# Patient Record
Sex: Female | Born: 1960
Health system: Southern US, Community
[De-identification: ages and names within clinical notes are randomized; demographics above are authoritative.]

## PROBLEM LIST (undated history)

## (undated) DIAGNOSIS — K219 Gastro-esophageal reflux disease without esophagitis: Secondary | ICD-10-CM

## (undated) DIAGNOSIS — E039 Hypothyroidism, unspecified: Secondary | ICD-10-CM

## (undated) DIAGNOSIS — I209 Angina pectoris, unspecified: Secondary | ICD-10-CM

## (undated) DIAGNOSIS — G5600 Carpal tunnel syndrome, unspecified upper limb: Secondary | ICD-10-CM

## (undated) HISTORY — PX: TUBAL LIGATION: SHX77

## (undated) HISTORY — PX: BREAST SURGERY: SHX581

## (undated) HISTORY — PX: CARPAL TUNNEL RELEASE: SHX101

---

## 1999-01-22 ENCOUNTER — Other Ambulatory Visit: Admission: RE | Admit: 1999-01-22 | Discharge: 1999-01-22 | Payer: Self-pay | Admitting: Obstetrics and Gynecology

## 1999-05-21 ENCOUNTER — Emergency Department (HOSPITAL_COMMUNITY): Admission: EM | Admit: 1999-05-21 | Discharge: 1999-05-21 | Payer: Self-pay | Admitting: *Deleted

## 1999-09-30 ENCOUNTER — Other Ambulatory Visit: Admission: RE | Admit: 1999-09-30 | Discharge: 1999-09-30 | Payer: Self-pay | Admitting: Obstetrics and Gynecology

## 2000-12-08 ENCOUNTER — Other Ambulatory Visit: Admission: RE | Admit: 2000-12-08 | Discharge: 2000-12-08 | Payer: Self-pay | Admitting: Obstetrics and Gynecology

## 2002-01-24 ENCOUNTER — Other Ambulatory Visit: Admission: RE | Admit: 2002-01-24 | Discharge: 2002-01-24 | Payer: Self-pay | Admitting: Obstetrics and Gynecology

## 2002-02-10 ENCOUNTER — Emergency Department (HOSPITAL_COMMUNITY): Admission: EM | Admit: 2002-02-10 | Discharge: 2002-02-10 | Payer: Self-pay | Admitting: Emergency Medicine

## 2002-04-02 ENCOUNTER — Encounter: Payer: Self-pay | Admitting: Internal Medicine

## 2002-04-02 ENCOUNTER — Encounter: Admission: RE | Admit: 2002-04-02 | Discharge: 2002-04-02 | Payer: Self-pay | Admitting: Internal Medicine

## 2002-04-12 ENCOUNTER — Encounter (INDEPENDENT_AMBULATORY_CARE_PROVIDER_SITE_OTHER): Payer: Self-pay | Admitting: Specialist

## 2002-04-12 ENCOUNTER — Ambulatory Visit (HOSPITAL_COMMUNITY): Admission: RE | Admit: 2002-04-12 | Discharge: 2002-04-12 | Payer: Self-pay | Admitting: Internal Medicine

## 2002-04-12 ENCOUNTER — Encounter: Payer: Self-pay | Admitting: Internal Medicine

## 2002-08-09 ENCOUNTER — Other Ambulatory Visit: Admission: RE | Admit: 2002-08-09 | Discharge: 2002-08-09 | Payer: Self-pay | Admitting: Radiology

## 2003-01-29 ENCOUNTER — Other Ambulatory Visit: Admission: RE | Admit: 2003-01-29 | Discharge: 2003-01-29 | Payer: Self-pay | Admitting: Obstetrics and Gynecology

## 2003-06-16 ENCOUNTER — Encounter (INDEPENDENT_AMBULATORY_CARE_PROVIDER_SITE_OTHER): Payer: Self-pay | Admitting: Specialist

## 2003-06-16 ENCOUNTER — Ambulatory Visit (HOSPITAL_COMMUNITY): Admission: RE | Admit: 2003-06-16 | Discharge: 2003-06-16 | Payer: Self-pay | Admitting: Obstetrics and Gynecology

## 2004-02-18 ENCOUNTER — Other Ambulatory Visit: Admission: RE | Admit: 2004-02-18 | Discharge: 2004-02-18 | Payer: Self-pay | Admitting: Obstetrics and Gynecology

## 2004-02-25 ENCOUNTER — Ambulatory Visit (HOSPITAL_COMMUNITY): Admission: RE | Admit: 2004-02-25 | Discharge: 2004-02-25 | Payer: Self-pay | Admitting: Obstetrics and Gynecology

## 2004-07-20 DIAGNOSIS — D099 Carcinoma in situ, unspecified: Secondary | ICD-10-CM | POA: Insufficient documentation

## 2005-01-11 ENCOUNTER — Other Ambulatory Visit: Admission: RE | Admit: 2005-01-11 | Discharge: 2005-01-11 | Payer: Self-pay | Admitting: Obstetrics and Gynecology

## 2005-03-15 ENCOUNTER — Ambulatory Visit (HOSPITAL_COMMUNITY): Admission: RE | Admit: 2005-03-15 | Discharge: 2005-03-15 | Payer: Self-pay | Admitting: Obstetrics and Gynecology

## 2005-12-21 ENCOUNTER — Other Ambulatory Visit: Admission: RE | Admit: 2005-12-21 | Discharge: 2005-12-21 | Payer: Self-pay | Admitting: Obstetrics and Gynecology

## 2006-02-22 ENCOUNTER — Encounter: Admission: RE | Admit: 2006-02-22 | Discharge: 2006-02-22 | Payer: Self-pay | Admitting: Internal Medicine

## 2006-03-17 ENCOUNTER — Ambulatory Visit (HOSPITAL_COMMUNITY): Admission: RE | Admit: 2006-03-17 | Discharge: 2006-03-17 | Payer: Self-pay | Admitting: Obstetrics and Gynecology

## 2007-02-21 ENCOUNTER — Encounter: Admission: RE | Admit: 2007-02-21 | Discharge: 2007-02-21 | Payer: Self-pay | Admitting: Endocrinology

## 2007-03-06 ENCOUNTER — Other Ambulatory Visit: Admission: RE | Admit: 2007-03-06 | Discharge: 2007-03-06 | Payer: Self-pay | Admitting: Interventional Radiology

## 2007-03-06 ENCOUNTER — Encounter: Admission: RE | Admit: 2007-03-06 | Discharge: 2007-03-06 | Payer: Self-pay | Admitting: Endocrinology

## 2007-03-06 ENCOUNTER — Encounter (INDEPENDENT_AMBULATORY_CARE_PROVIDER_SITE_OTHER): Payer: Self-pay | Admitting: Interventional Radiology

## 2007-03-20 ENCOUNTER — Ambulatory Visit (HOSPITAL_COMMUNITY): Admission: RE | Admit: 2007-03-20 | Discharge: 2007-03-20 | Payer: Self-pay | Admitting: Obstetrics and Gynecology

## 2008-03-21 ENCOUNTER — Ambulatory Visit (HOSPITAL_COMMUNITY): Admission: RE | Admit: 2008-03-21 | Discharge: 2008-03-21 | Payer: Self-pay | Admitting: Obstetrics and Gynecology

## 2008-09-30 ENCOUNTER — Encounter: Admission: RE | Admit: 2008-09-30 | Discharge: 2008-09-30 | Payer: Self-pay | Admitting: Endocrinology

## 2009-03-23 ENCOUNTER — Ambulatory Visit (HOSPITAL_COMMUNITY): Admission: RE | Admit: 2009-03-23 | Discharge: 2009-03-23 | Payer: Self-pay | Admitting: Obstetrics and Gynecology

## 2009-06-10 ENCOUNTER — Encounter: Admission: RE | Admit: 2009-06-10 | Discharge: 2009-06-10 | Payer: Self-pay | Admitting: Endocrinology

## 2009-12-09 ENCOUNTER — Encounter: Admission: RE | Admit: 2009-12-09 | Discharge: 2009-12-09 | Payer: Self-pay | Admitting: Endocrinology

## 2010-03-24 ENCOUNTER — Ambulatory Visit (HOSPITAL_COMMUNITY): Admission: RE | Admit: 2010-03-24 | Discharge: 2010-03-24 | Payer: Self-pay | Admitting: Obstetrics and Gynecology

## 2010-09-26 ENCOUNTER — Encounter: Payer: Self-pay | Admitting: Endocrinology

## 2010-10-04 LAB — CBC
HCT: 37.2 % (ref 36.0–46.0)
Hemoglobin: 12 g/dL (ref 12.0–15.0)
MCHC: 32.3 g/dL (ref 30.0–36.0)
Platelets: 220 10*3/uL (ref 150–400)
RBC: 4.49 MIL/uL (ref 3.87–5.11)

## 2010-10-04 LAB — DIFFERENTIAL
Basophils Relative: 0 % (ref 0–1)
Eosinophils Relative: 2 % (ref 0–5)
Lymphocytes Relative: 53 % — ABNORMAL HIGH (ref 12–46)
Monocytes Relative: 8 % (ref 3–12)
Neutrophils Relative %: 38 % — ABNORMAL LOW (ref 43–77)

## 2010-10-04 LAB — SURGICAL PCR SCREEN: MRSA, PCR: NEGATIVE

## 2010-10-04 LAB — COMPREHENSIVE METABOLIC PANEL
Chloride: 103 mEq/L (ref 96–112)
GFR calc Af Amer: >60 mL/min (ref >60)
Sodium: 137 mEq/L (ref 135–145)
Total Bilirubin: 0.5 mg/dL (ref 0.3–1.2)
Total Protein: 7.9 g/dL (ref 6.0–8.3)

## 2010-10-13 ENCOUNTER — Encounter: Payer: Self-pay | Admitting: Family Medicine

## 2010-10-13 ENCOUNTER — Ambulatory Visit (HOSPITAL_COMMUNITY)
Admission: RE | Admit: 2010-10-13 | Discharge: 2010-10-14 | Disposition: A | Discharge status: Home / Self Care | Payer: 59 | Attending: General Surgery | Admitting: General Surgery

## 2010-10-13 ENCOUNTER — Other Ambulatory Visit: Payer: Self-pay | Admitting: General Surgery

## 2010-10-13 DIAGNOSIS — K219 Gastro-esophageal reflux disease without esophagitis: Secondary | ICD-10-CM | POA: Insufficient documentation

## 2010-10-13 DIAGNOSIS — E042 Nontoxic multinodular goiter: Secondary | ICD-10-CM | POA: Insufficient documentation

## 2010-10-13 HISTORY — PX: TOTAL THYROIDECTOMY: SHX2547

## 2010-10-13 LAB — CALCIUM: Calcium: 9.2 mg/dL (ref 8.4–10.5)

## 2010-10-14 LAB — GLUCOSE, CAPILLARY: Glucose-Capillary: 97 mg/dL (ref 70–99)

## 2010-10-14 LAB — CALCIUM: Calcium: 10.5 mg/dL (ref 8.4–10.5)

## 2010-10-21 NOTE — Op Note (Addendum)
Colleen Carter, Colleen Carter               ACCOUNT NO.:  [id number]  MEDICAL RECORD NO.:  [id number]           PATIENT TYPE:  O  LOCATION:  1606                         FACILITY:  [facility] [place]  PHYSICIAN:  Lennie Muckle, MD      DATE OF BIRTH:  March 06, 1961  DATE OF PROCEDURE:  10/13/2010 DATE OF DISCHARGE:                              OPERATIVE REPORT   PREOPERATIVE DIAGNOSIS:  Multinodular goiter.  POSTOPERATIVE DIAGNOSIS:  Multinodular goiter.  PROCEDURE:  Total thyroidectomy.  SURGEON:  Brienne Liguori L. Freida Busman, MD  ASSISTANT:  Anselm Pancoast. Weatherly, MD  FINDINGS:  Multiple nodules with a very large left thyroid gland.  SPECIMENS:  Thyroid.  Minimal amount of blood loss.  ANESTHESIA:  General endotracheal anesthesia.  No immediate complications.  The patient to PACU in stable condition.  INDICATIONS FOR PROCEDURE:  Colleen Carter is a 50 year old female who had had a goiter for sometime.  She had been on Synthroid and began to have difficulty with dysphagia and compression type symptoms.  She was seen preoperatively.  Risks of surgery were explained and informed consent was obtained.  DETAILS OF PROCEDURE:  Colleen Carter was identified in the preoperative holding area and all questions were answered.  She was seen by Anesthesia and taken to the operating room.  She received 2 g of cefazolin preoperatively.  Once in the operating room, placed in supine position after administration of general endotracheal anesthesia, sequential compression devices were applied to her lower extremity. Head and arms tucked and a towel roll placed beneath the shoulder blades.  Anterior neck was prepped and draped in usual sterile fashion. Surgical time-out performed.  I began by palpating the anatomic landmark at the  sternal notch and the trachea.  Two fingerbreadths above the sternal notch, I did an incision approximately 8 cm in length.  Divided the skin with a #15 blade.  Subcutaneous tissues divided  by electrocautery, divide the platysma muscle, created flaps superiorly and anteriorly, small anterior vein was controlled with a 2-0 silk tie. With elevated the flap, the platysmas inferiorly and superiorly, I chose the midline of the strap muscle.  I divided this with electrocautery, began dissecting on the left thyroid gland.  I swept away the strap muscles with electrocautery and blunt dissection.  The left lobe was rather large.  I was able to gain access to the superior pole vessels, placed 2 clips proximally and 1 distally, and transected with the Harmonic scalpel.  I continued dissecting inferiorly down towards the middle pole.  I then chose to release the inferior pole.  I swept the strap muscles away from the thyroid and likely was able to sweep away without much difficulty.  I was able to clip and divide the inferior pole vessels towards the middle pole vessels, we were able to visualize the recurrent laryngeal nerve, staying high on the thyroid gland and swept the small feeding vessels and placed clips on the smaller vessels. Using Harmonic scalpel, dissected the thyroid away from the vicinity of the recurrent laryngeal nerve.  We were able to continue dissecting the thyroid towards the midline.  We then found branch  of the pyramidal pole.  With an electrocautery, we brought this down towards the midline. We then continued dissecting on the right thyroid gland sweeping the strap muscles away using electrocautery and blunt dissection.  I placed superior pole vessels, placed 2 clips proximally and 1 distally, and transected with the Harmonic scalpel.  Then, I was able to elevate the inferior pole and divide these vessels with clips and the Harmonic scalpel.  We also were able to visualize the recurrent laryngeal nerve as well as the superior parathyroid within the vicinity.  I swept the superior parathyroid away from the thyroid gland using blunt dissection and swept the thyroid  gland away from the recurrent laryngeal nerve. Staying high on the thyroid gland, I was able to sweep this away without difficulty and dissected towards the trachea. Once the specimen was removed, I marked the superior pole of the right thyroid with a 2-0 silk suture.  I then irrigated the wound bed, found minimal amount of oozing which I placed a small clip on the left side.  Upon final irrigation and look, there was no evidence of bleeding.  Recurrent laryngeal nerves were intact and visualized.  I then placed Surgicel within both wound beds.  The patient had Valsalva maneuver and found no bleeding.  I then reapproximated strap muscles in midline with a 3-0 Vicryl running suture, platysma was reapproximated using interrupted sutures.  I then reapproximated the dermis with a 3-0 Vicryl and skin was closed with 4-0 Monocryl.  I injected 50 mL of 0.25% Marcaine for local anesthesia. Steri-Strips placed as a final dressing after closing the skin with a 4- 0 Monocryl.  She was awakened and transferred to post-anesthesia care unit in stable condition.  She will have her calcium monitored daily and discharged home tomorrow.     Lennie Muckle, MD ALA/MEDQ  D:  10/13/2010  T:  10/14/2010  Job:  [id number]  cc:   Dorisann Frames, M.D. Fax: [phone]  Hal Morales, M.D. Fax: [phone]  Electronically Signed by Bertram Savin MD on 10/21/2010 02:16:54 PM

## 2011-01-21 NOTE — Op Note (Signed)
NAMETonye Carter                         ACCOUNT NO.:  [id number]   MEDICAL RECORD NO.:  [id number]                   PATIENT TYPE:  AMB   LOCATION:  SDC                                  FACILITY:  [facility]   PHYSICIAN:  Hal Morales, M.D.             DATE OF BIRTH:  1961-01-15   DATE OF PROCEDURE:  06/16/2003  DATE OF DISCHARGE:                                 OPERATIVE REPORT   PREOPERATIVE DIAGNOSIS:  Desire for surgical sterilization.   POSTOPERATIVE DIAGNOSES:  Desire for surgical sterilization, plus uterine  fibroids, and rule out endometriosis.   OPERATION:  Operative laparoscopy, laparoscopic tubal cautery, and posterior  peritoneal biopsy.   SURGEON:  Hal Morales, M.D.   ANESTHESIA:  General orotracheal.   ESTIMATED BLOOD LOSS:  Less than 25 cc.   COMPLICATIONS:  None.   FINDINGS:  The uterus was upper limits of normal size.  There were less than  2 cm myomata on the left anterior fundus and left cornual region.  There was  a single powder burn type lesion in the center of the posterior cul-de-sac  between the two uterosacral ligaments.  The ovaries appeared normal  bilaterally except for a functional cyst on the right.  There were no other  stigmata of endometriosis.  The tubes appeared normal.   PROCEDURE:  The patient was taken to the operating room after appropriate  identification and placed on the operating table.  After the attainment of  adequate general anesthesia, she was placed in the modified lithotomy  position.  The abdomen, perineum, and vagina were prepped with multiple  layers of Betadine.  A red Robinson catheter was used to empty the bladder,  and a single-toothed tenaculum placed on the anterior cervix.  The abdomen  was draped as a sterile field.  A subumbilical and suprapubic injection of  0.25% Marcaine for a total of 10 cc was undertaken.  A subumbilical incision  was made and the Veress cannula placed through that incision into  the  peritoneal cavity.  A pneumoperitoneum was created with 4 liters of CO2.  The laparoscopic trocar was placed through the subumbilical incision, and  the laparoscope placed through the trocar sleeve.  The above-noted findings  were made and documented.  The right fallopian tube was then identified,  followed to its fimbriated end, then grasped at the isthmic portion and  cauterized in two adjacent areas.  The left fallopian tube was treated in a  likewise manner.  A biopsy of the posterior cul-de-sac powder burn lesion  was undertaken with hemostasis noted to be adequate.  All instruments were  then removed from the peritoneal cavity under direct visualization as the  CO2 was allowed to escape.  A fascial suture of 0 Vicryl was placed in the  subumbilical incision and a subcuticular suture of 3-0 Vicryl used to close  the skin incision.  The Veress cannula was  removed, and two sutures of 0  Vicryl used to achieve hemostasis in the cervix at the site of the tenaculum  stick.  The patient was awakened from general  anesthesia and taken to the recovery room in satisfactory condition having  tolerated the procedure well with sponge and instrument counts correct.   SPECIMENS TO PATHOLOGY:  Posterior cul-de-sac biopsy, rule out  endometriosis.                                               Hal Morales, M.D.    VPH/MEDQ  D:  06/16/2003  T:  06/16/2003  Job:  [id number]

## 2011-01-21 NOTE — H&P (Signed)
NAMETonye Carter                         ACCOUNT NO.:  [id number]   MEDICAL RECORD NO.:  [id number]                   PATIENT TYPE:  AMB   LOCATION:  SDC                                  FACILITY:  [facility]   PHYSICIAN:  Hal Morales, M.D.             DATE OF BIRTH:  Jan 06, 1961   DATE OF ADMISSION:  DATE OF DISCHARGE:                                HISTORY & PHYSICAL   DATE OF OPERATION:  June 16, 2003   HISTORY OF PRESENT ILLNESS:  The patient is a 50 year old black single  female para 0 who wishes surgical sterilization.  The patient has used oral  contraceptive pills for many years for contraception with good success in  regularization of her usually irregular periods as well as good  contraception.  The patient was able to discontinue cigarette smoking at one  point in time; however, has started smoking again and realizes the dangers  of continued birth control pill use and cigarette smoking.  After  consideration of other options for contraception the patient has decided to  proceed with surgical sterilization.  She understands the permanence of the  procedure as well as the risks of tubal failure with subsequent pregnancy.  Last menstrual period was May 03, 2003.  The patient took her last birth  control pill in July 2004 and had withdrawal menses, then the menstrual  cycle on May 03, 2003 lasting approximately five days.  The patient has  had no menses since that time.  She is currently abstinent with not having  had intercourse for over a year.   PAST MEDICAL HISTORY:  Negative.   SURGICAL HISTORY:  In 2003 the patient had a reduction mammoplasty and in  May 2004 she had removal of scar tissue from the right breast.   CURRENT MEDICATIONS:  None.   DRUG SENSITIVITIES:  None.   HABITS:  The patient smokes approximately 18 cigarettes per week.   SOCIAL HISTORY:  She lives alone and works as a Chartered certified accountant for [employer].   FAMILY HISTORY:  Significant for a  sister with diabetes and chronic  hypertension.   REVIEW OF SYSTEMS:  Essential negative.   PHYSICAL EXAMINATION:  GENERAL:  The patient is a well-developed black  female in no acute distress.  VITAL SIGNS:  The blood pressure is 124/60, weight is 225 pounds.  LUNGS:  Clear.  HEART:  Regular rate and rhythm.  ABDOMEN:  Soft without masses or organomegaly.  EXTREMITIES:  No clubbing, cyanosis, or edema.  PELVIC:  EG/BUS within normal limits.  The vagina is rugous.  The cervix is  without gross lesions.  The uterus is upper limits of normal size, mobile,  and nontender.  Adnexa:  No masses.  Rectovaginal:  No masses.   IMPRESSION:  1. Desire for surgical sterilization.  2. Long history of irregular menses.   DISPOSITION:  A discussion is held with the patient concerning the  indications for her procedure as well as the risks involved which include  but are not limited to anesthesia, bleeding, infection, and damage to  adjacent organs, as well as the risk of failure of tubal sterilization with  subsequent pregnancy.  The patient seems to understand and wishes to  proceed.  She will have routine laboratory studies performed as well as a  urine pregnancy test prior to her surgical procedure.  She has a normal TSH  a year ago.  The surgery will be performed at [facility] on June 16, 2003.                                               Hal Morales, M.D.    VPH/MEDQ  D:  06/10/2003  T:  06/10/2003  Job:  [id number]

## 2011-03-14 ENCOUNTER — Other Ambulatory Visit (HOSPITAL_COMMUNITY): Payer: Self-pay | Admitting: Obstetrics and Gynecology

## 2011-03-14 DIAGNOSIS — Z1231 Encounter for screening mammogram for malignant neoplasm of breast: Secondary | ICD-10-CM

## 2011-03-30 ENCOUNTER — Ambulatory Visit (HOSPITAL_COMMUNITY)
Admission: RE | Admit: 2011-03-30 | Discharge: 2011-03-30 | Disposition: A | Discharge status: Home / Self Care | Payer: 59 | Source: Ambulatory Visit | Attending: Obstetrics and Gynecology | Admitting: Obstetrics and Gynecology

## 2011-03-30 DIAGNOSIS — Z1231 Encounter for screening mammogram for malignant neoplasm of breast: Secondary | ICD-10-CM | POA: Insufficient documentation

## 2011-05-20 ENCOUNTER — Emergency Department (HOSPITAL_COMMUNITY)
Admission: EM | Admit: 2011-05-20 | Discharge: 2011-05-20 | Disposition: A | Discharge status: Home / Self Care | Payer: 59 | Attending: Emergency Medicine | Admitting: Emergency Medicine

## 2011-05-20 DIAGNOSIS — R040 Epistaxis: Secondary | ICD-10-CM | POA: Insufficient documentation

## 2012-02-23 ENCOUNTER — Other Ambulatory Visit: Payer: Self-pay | Admitting: Obstetrics and Gynecology

## 2012-02-23 DIAGNOSIS — Z1231 Encounter for screening mammogram for malignant neoplasm of breast: Secondary | ICD-10-CM

## 2012-03-18 ENCOUNTER — Ambulatory Visit (INDEPENDENT_AMBULATORY_CARE_PROVIDER_SITE_OTHER): Payer: 59 | Admitting: Family Medicine

## 2012-03-18 VITALS — BP 114/74 | HR 75 | Temp 98.1°F | Resp 16 | Ht 67.0 in | Wt 219.0 lb

## 2012-03-18 DIAGNOSIS — S91009A Unspecified open wound, unspecified ankle, initial encounter: Secondary | ICD-10-CM

## 2012-03-18 DIAGNOSIS — Z23 Encounter for immunization: Secondary | ICD-10-CM

## 2012-03-18 DIAGNOSIS — S81019A Laceration without foreign body, unspecified knee, initial encounter: Secondary | ICD-10-CM

## 2012-03-18 NOTE — Progress Notes (Signed)
   Date:  03/18/2012   Name:  Colleen Carter   DOB:  21-Apr-1961   MRN:  [id number]  PCP:  Alva Garnet., MD    Chief Complaint: Knee Injury   History of Present Illness:  Colleen Carter is a 51 y.o. very pleasant female patient who presents with the following:  Yesterday she was walking in some high heels- she tripped and fell onto her left knee.  She has been using neosporin and peroxide, but the knee continues to bleed, and it feels "sore."  Her last tetanus shot was many years ago.  She is otherwise unhurt  There is no problem list on file for this patient.   No past medical history on file.  No past surgical history on file.  History  Substance Use Topics  . Smoking status: Former Games developer  . Smokeless tobacco: Not on file  . Alcohol Use: Not on file    No family history on file.  No Known Allergies  Medication list has been reviewed and updated.  Current Outpatient Prescriptions on File Prior to Visit  Medication Sig Dispense Refill  . calcium carbonate 200 MG capsule Take 250 mg by mouth 2 (two) times daily with a meal.      . levothyroxine (SYNTHROID, LEVOTHROID) 125 MCG tablet Take 125 mcg by mouth daily.        Review of Systems:  As per HPI- otherwise negative.   Physical Examination: Filed Vitals:   03/18/12 1251  BP: 114/74  Pulse: 75  Temp: 98.1 F (36.7 C)  Resp: 16   Filed Vitals:   03/18/12 1251  Height: 5\' 7"  (1.702 m)  Weight: 219 lb (99.338 kg)   Body mass index is 34.30 kg/(m^2). Ideal Body Weight: Weight in (lb) to have BMI = 25: 159.3    GEN: WDWN, NAD, Non-toxic, Alert & Oriented x 3, obese HEENT: Atraumatic, Normocephalic.  Ears and Nose: No external deformity. EXTR: No clubbing/cyanosis/edema NEURO: Normal gait.   No limp PSYCH: Normally interactive. Conversant. Not depressed or anxious appearing.  Calm demeanor.  Left knee; there is a shallow laceration over the anterior knee, just above the proximal tibia. It is  about 1.5 cm in length. The knee is stable and exam does not suggest a fracture.  She has full ROM and mild tenderness, mostly due to laceration.  No deep structures are affected.    Cleaned and irrigated wound, explored with forceps- it is not deep.  Closed with steri- strips and bandaged.    Assessment and Plan: 1. Need for diphtheria-tetanus-pertussis (Tdap) vaccine, adult/adolescent  Tdap vaccine greater than or equal to 7yo IM  2. Laceration of knee     Update Tdap.  Wound care instructions. Patient (or parent if minor) instructed to return to clinic or call if not better in 2-3 day(s).  Went over signs of infection and when to call or RTC.     Abbe Amsterdam, MD

## 2012-03-29 ENCOUNTER — Ambulatory Visit (INDEPENDENT_AMBULATORY_CARE_PROVIDER_SITE_OTHER): Payer: 59 | Admitting: Family Medicine

## 2012-03-29 VITALS — BP 132/80 | HR 74 | Temp 98.0°F | Resp 16 | Ht 66.5 in | Wt 219.6 lb

## 2012-03-29 DIAGNOSIS — S80219A Abrasion, unspecified knee, initial encounter: Secondary | ICD-10-CM

## 2012-03-29 DIAGNOSIS — IMO0002 Reserved for concepts with insufficient information to code with codable children: Secondary | ICD-10-CM

## 2012-03-29 DIAGNOSIS — L98499 Non-pressure chronic ulcer of skin of other sites with unspecified severity: Secondary | ICD-10-CM

## 2012-03-29 MED ORDER — CEPHALEXIN 500 MG PO CAPS: 500.0000 mg | ORAL_CAPSULE | Freq: Three times a day (TID) | ORAL | Status: AC

## 2012-03-29 NOTE — Patient Instructions (Signed)
Keep it dressed with a dry dressing. Use a stick free Telfa type of material. Clean it once or twice daily with a little soapy water and rinse well. Take the Keflex 3 times daily. Return in 5 days for a recheck.

## 2012-03-29 NOTE — Progress Notes (Signed)
Subjective: Patient was here 11 days ago and seen by Dr. Dallas Schimke with an abrasion of her left knee. It is continued to hurt but the main thing is that this is been very slow healing in. 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  Objective: Four CM area of new skin, with a 2 CM area of ulceration. This has a yellow eschar over it. This was debrided off, and the mucoid pus underneath it was cultured and cleaned off. There is a tiny sinus tract lateral to the wound, but does not seem to have a lot of draining from it.  Assessment: Abrasion and ulceration left knee Debridement of ulceration and eschar  Plan: Documented with a photo. Placed her on Keflex. Have her return in 5 days for a recheck, sooner if problems.

## 2012-03-30 ENCOUNTER — Ambulatory Visit (HOSPITAL_COMMUNITY)
Admission: RE | Admit: 2012-03-30 | Discharge: 2012-03-30 | Disposition: A | Discharge status: Home / Self Care | Payer: 59 | Source: Ambulatory Visit | Attending: Obstetrics and Gynecology | Admitting: Obstetrics and Gynecology

## 2012-03-30 DIAGNOSIS — Z1231 Encounter for screening mammogram for malignant neoplasm of breast: Secondary | ICD-10-CM | POA: Insufficient documentation

## 2012-04-03 ENCOUNTER — Encounter: Payer: Self-pay | Admitting: Family Medicine

## 2012-04-03 ENCOUNTER — Ambulatory Visit (INDEPENDENT_AMBULATORY_CARE_PROVIDER_SITE_OTHER): Payer: 59 | Admitting: Family Medicine

## 2012-04-03 ENCOUNTER — Other Ambulatory Visit: Payer: Self-pay | Admitting: Family Medicine

## 2012-04-03 VITALS — BP 112/72 | HR 77 | Temp 97.8°F | Resp 16 | Ht 67.18 in | Wt 219.0 lb

## 2012-04-03 DIAGNOSIS — L98499 Non-pressure chronic ulcer of skin of other sites with unspecified severity: Secondary | ICD-10-CM

## 2012-04-03 NOTE — Progress Notes (Signed)
Subjective: Wound is been very slow healing. She usually keeps it dressed, except for occasionally linear get to it when she is at home. She is still on the Keflex. That gets to be some greenish drainage in the center of it which he takes the dressing off.  Objective: Clean appearing ulcer cavity on her left knee, measuring 1.6 x 1.1 cm. The crater is a couple of millimeters deep, has granulation tissue inside it, and does not have an eschar. The border is well healed, purpleish new scar tissue.  Assessment: Cutaneous ulcer left knee, improving  Plan: Patient will keep a close eye on it and continue the good local care. She is to let it get some air a couple of times a day. If it gives her any concern she will come back for recheck. Gave her fast-track if I need to recheck it. Wound was recultured.

## 2012-04-03 NOTE — Patient Instructions (Signed)
Return if needed

## 2012-04-06 LAB — WOUND CULTURE
Gram Stain: NONE SEEN
Gram Stain: NONE SEEN

## 2012-04-10 IMAGING — CR DG CHEST 2V
2 series · 2 of 2 positions shown · non-contrast
Comparison: None

CLINICAL DATA: Preop goiter

CHEST - 2 VIEW

[w chest pa]
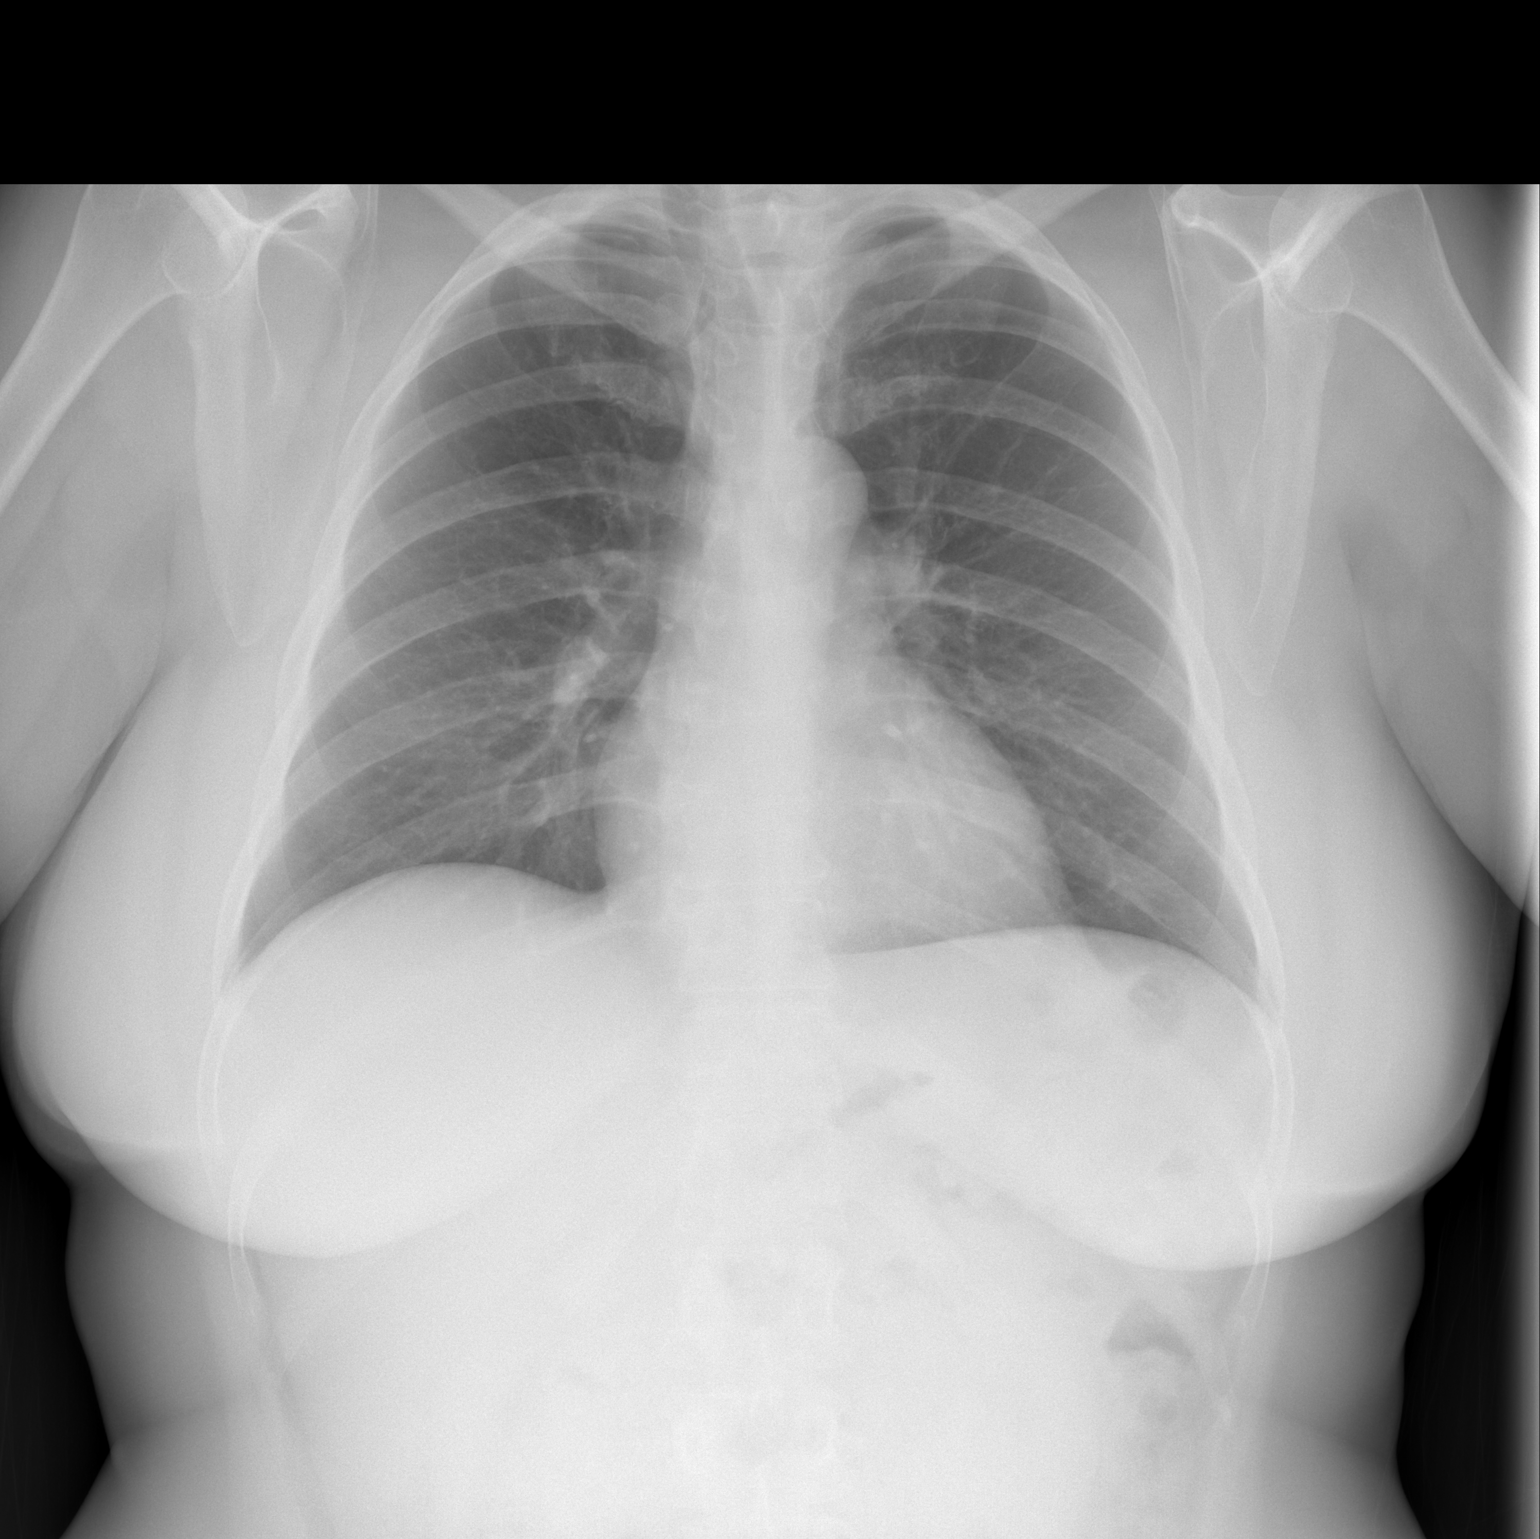

[w chest lat]
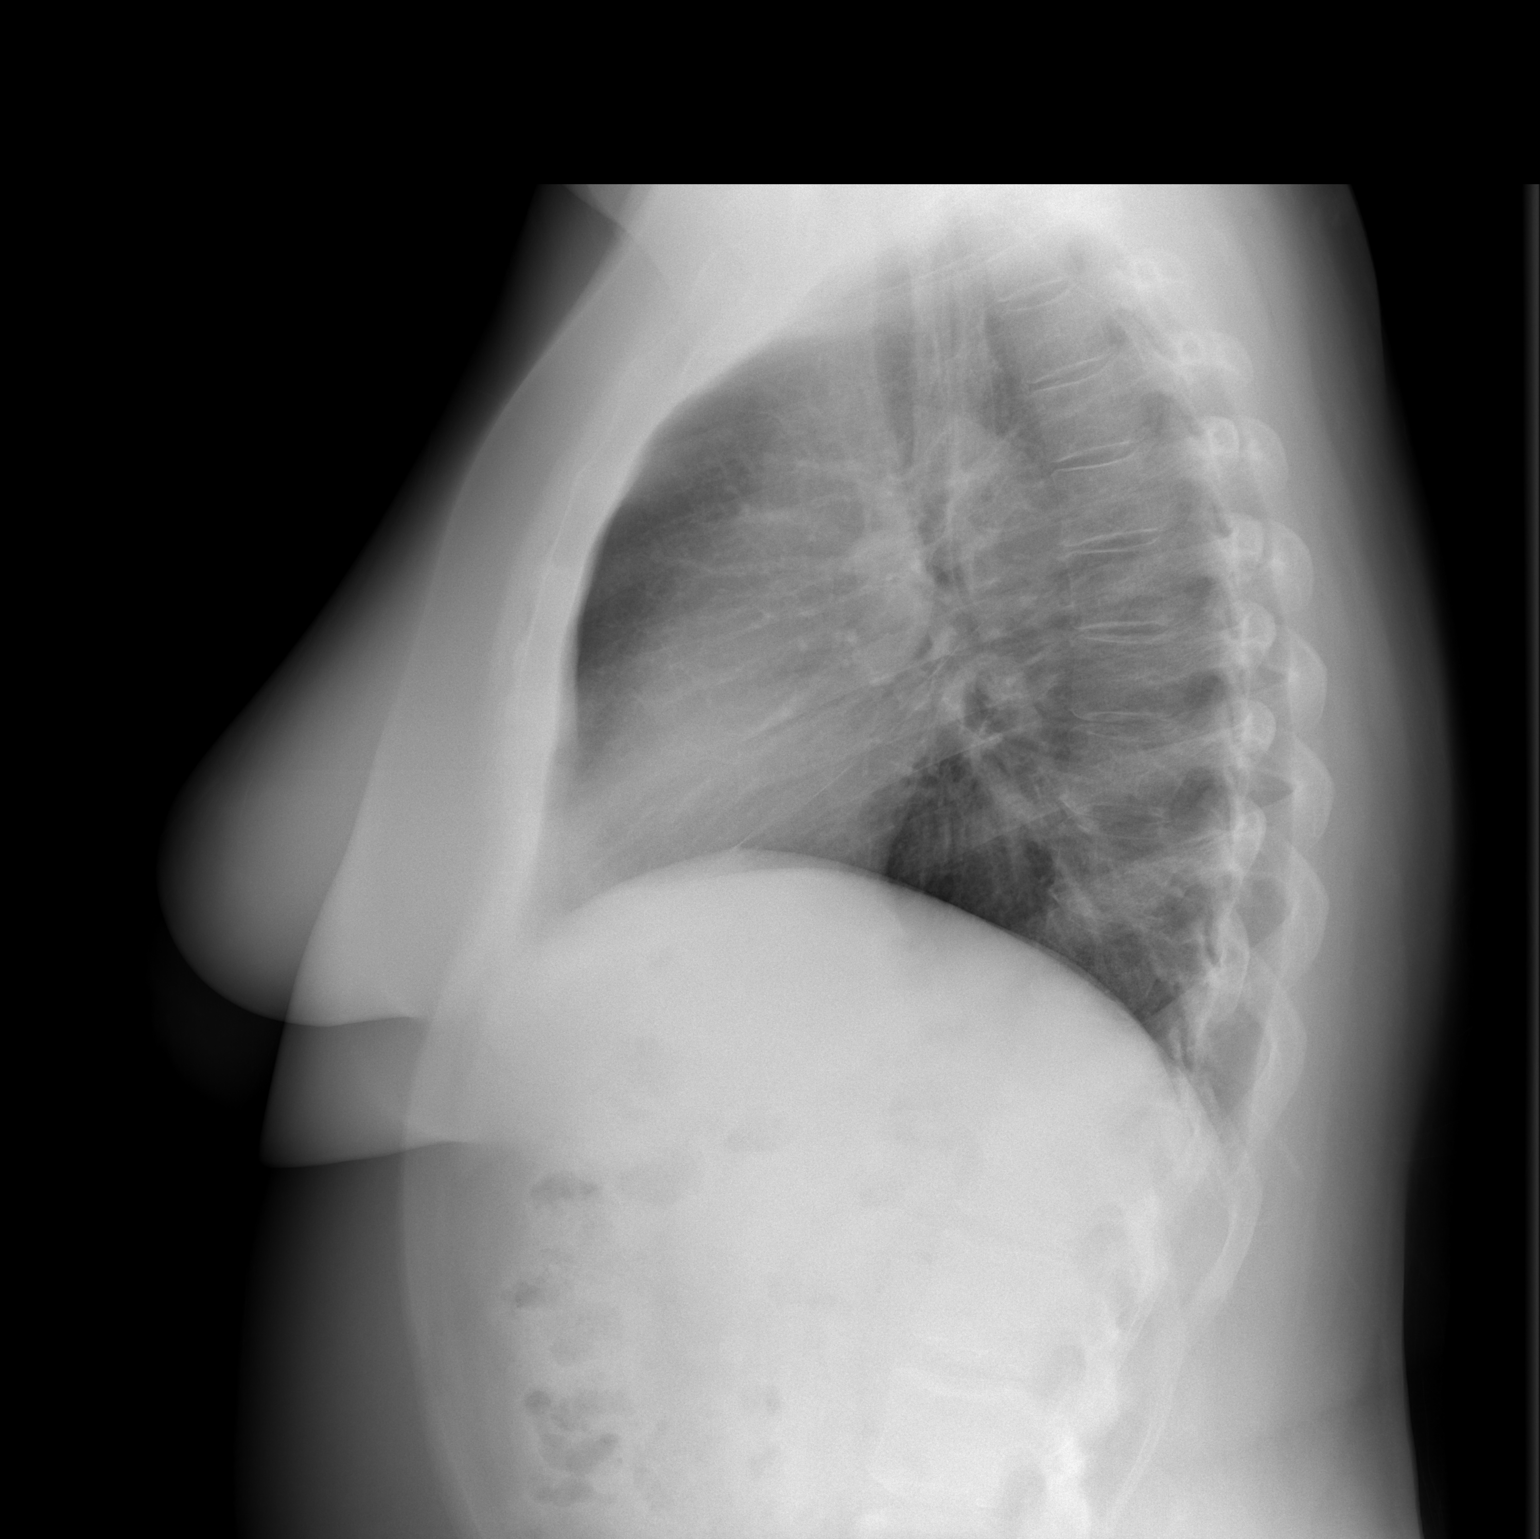

[2 of 2 positions shown; findings below may reference images not displayed]

FINDINGS: Heart size is normal and the vascularity is normal.
Lungs are clear without infiltrate or mass.

There is rightward deviation of the trachea compatible with left-
sided goiter.
IMPRESSION: No active cardiopulmonary disease

Left-sided goiter

## 2012-04-11 ENCOUNTER — Encounter: Payer: Self-pay | Admitting: Obstetrics and Gynecology

## 2012-04-13 ENCOUNTER — Ambulatory Visit (INDEPENDENT_AMBULATORY_CARE_PROVIDER_SITE_OTHER): Payer: 59 | Admitting: Emergency Medicine

## 2012-04-13 VITALS — BP 115/73 | HR 72 | Temp 98.4°F | Resp 17 | Ht 68.0 in | Wt 218.0 lb

## 2012-04-13 DIAGNOSIS — R040 Epistaxis: Secondary | ICD-10-CM

## 2012-04-13 NOTE — Patient Instructions (Addendum)

## 2012-04-13 NOTE — Progress Notes (Signed)
   Date:  04/13/2012   Name:  Colleen Carter   DOB:  1960/10/24   MRN:  [id number] Gender: female  Age: 51 y.o.  PCP:  Alva Garnet., MD    Chief Complaint: Epistaxis   History of Present Illness:  Colleen Carter is a 51 y.o. pleasant patient who presents with the following:  Experienced a nose bleed twice yesterday and required cauterization twice last summer for right nose bleeds by ENT.  Denies trauma, sprays, meds, etc.  Claims spontaneous  Patient Active Problem List  Diagnosis  . Skin ulcer    No past medical history on file.  No past surgical history on file.  History  Substance Use Topics  . Smoking status: Former Games developer  . Smokeless tobacco: Not on file  . Alcohol Use: Not on file    No family history on file.  No Known Allergies  Medication list has been reviewed and updated.  Current Outpatient Prescriptions on File Prior to Visit  Medication Sig Dispense Refill  . calcium carbonate 200 MG capsule Take 250 mg by mouth 2 (two) times daily with a meal.      . cholecalciferol (VITAMIN D) 1000 UNITS tablet Take 1,000 Units by mouth daily.      Marland Kitchen levothyroxine (SYNTHROID, LEVOTHROID) 125 MCG tablet Take 125 mcg by mouth daily.      . Multiple Vitamins-Minerals (MULTIVITAMIN WITH MINERALS) tablet Take 1 tablet by mouth daily.        Review of Systems:  As per HPI, otherwise negative.    Physical Examination: Filed Vitals:   04/13/12 1115  BP: 115/73  Pulse: 72  Temp: 98.4 F (36.9 C)  Resp: 17   Filed Vitals:   04/13/12 1115  Height: 5\' 8"  (1.727 m)  Weight: 218 lb (98.884 kg)   Body mass index is 33.15 kg/(m^2). Ideal Body Weight: Weight in (lb) to have BMI = 25: 164.1    GEN: WDWN, NAD, Non-toxic, Alert & Oriented x 3 HEENT: Atraumatic, Normocephalic. No evidence current or past nosebleed. Ears and Nose: No external deformity. EXTR: No clubbing/cyanosis/edema NEURO: Normal gait.  PSYCH: Normally interactive. Conversant. Not  depressed or anxious appearing.  Calm demeanor.    Assessment and Plan: Nosebleed by history  Follow up by ENT   Carmelina Dane, MD

## 2013-03-27 ENCOUNTER — Other Ambulatory Visit: Payer: Self-pay | Admitting: Obstetrics and Gynecology

## 2013-03-27 DIAGNOSIS — Z1231 Encounter for screening mammogram for malignant neoplasm of breast: Secondary | ICD-10-CM

## 2013-04-09 ENCOUNTER — Ambulatory Visit (HOSPITAL_COMMUNITY)
Admission: RE | Admit: 2013-04-09 | Discharge: 2013-04-09 | Disposition: A | Discharge status: Home / Self Care | Payer: 59 | Source: Ambulatory Visit | Attending: Obstetrics and Gynecology | Admitting: Obstetrics and Gynecology

## 2013-04-09 DIAGNOSIS — Z1231 Encounter for screening mammogram for malignant neoplasm of breast: Secondary | ICD-10-CM | POA: Insufficient documentation

## 2014-03-07 ENCOUNTER — Ambulatory Visit (INDEPENDENT_AMBULATORY_CARE_PROVIDER_SITE_OTHER): Payer: 59 | Admitting: Family Medicine

## 2014-03-07 VITALS — BP 110/72 | HR 73 | Temp 98.2°F | Resp 16 | Ht 67.0 in | Wt 199.6 lb

## 2014-03-07 DIAGNOSIS — R05 Cough: Secondary | ICD-10-CM

## 2014-03-07 DIAGNOSIS — R059 Cough, unspecified: Secondary | ICD-10-CM

## 2014-03-07 DIAGNOSIS — E039 Hypothyroidism, unspecified: Secondary | ICD-10-CM

## 2014-03-07 DIAGNOSIS — J0110 Acute frontal sinusitis, unspecified: Secondary | ICD-10-CM

## 2014-03-07 MED ORDER — CEFDINIR 300 MG PO CAPS: 300.0000 mg | ORAL_CAPSULE | Freq: Two times a day (BID) | ORAL | Status: DC

## 2014-03-07 MED ORDER — HYDROCODONE-HOMATROPINE 5-1.5 MG/5ML PO SYRP: 5.0000 mL | ORAL_SOLUTION | Freq: Three times a day (TID) | ORAL | Status: DC | PRN

## 2014-03-07 NOTE — Patient Instructions (Signed)
Good to see you today.  Use the omnicef antibiotic for sinus infection, and the cough syrup as needed.  Remember the syrup will make you feel sleepy- do not use it when you need to drive.  Let me know if you are not better in the next 3-4 days- Sooner if worse.

## 2014-03-07 NOTE — Progress Notes (Signed)
[facility] [address] [place] [address] [phone]  Date:  03/07/2014   Name:  Colleen Carter   DOB:  Mar 13, 1961   MRN:  [id number]  PCP:  Salena Saner., MD    Chief Complaint: Sinusitis   History of Present Illness:  Colleen Carter is a 53 y.o. very pleasant female patient who presents with the following:  She has noted sinus congestion for just over a week.  She had chills, ears and teeth hurt. She also has a cough.   She has not noted a fever.  She felt so cold that she had to put a jacket on last week.  She has had some body aches, and ST.   No GI symptoms.  She has been using some hall's which helped with her ST.  She is generally in good health She has tried several different OTC medications.   Her sinus sx are worse than her chest sx at this time.   History of hypothyroidism Patient Active Problem List   Diagnosis Date Noted  . Skin ulcer 04/03/2012    History reviewed. No pertinent past medical history.  History reviewed. No pertinent past surgical history.  History  Substance Use Topics  . Smoking status: Former Research scientist (life sciences)  . Smokeless tobacco: Not on file  . Alcohol Use: Not on file    Family History  Problem Relation Age of Onset  . Diabetes Sister   . Stroke Brother     No Known Allergies  Medication list has been reviewed and updated.  Current Outpatient Prescriptions on File Prior to Visit  Medication Sig Dispense Refill  . Alpha Lipoic Acid-Cr-Cinnamon (CINNAMON ALPHA LIPOIC AC CMPLX PO) Take by mouth.      . calcium carbonate 200 MG capsule Take 250 mg by mouth 2 (two) times daily with a meal.      . cholecalciferol (VITAMIN D) 1000 UNITS tablet Take 1,000 Units by mouth daily.      . fish oil-omega-3 fatty acids 1000 MG capsule Take 2 g by mouth daily.      Marland Kitchen levothyroxine (SYNTHROID, LEVOTHROID) 125 MCG tablet Take 125 mcg by mouth daily.      . Multiple Vitamins-Minerals (MULTIVITAMIN WITH MINERALS) tablet  Take 1 tablet by mouth daily.       No current facility-administered medications on file prior to visit.    Review of Systems:  As per HPI- otherwise negative.   Physical Examination: Filed Vitals:   03/07/14 0937  BP: 110/72  Pulse: 73  Temp: 98.2 F (36.8 C)  Resp: 16   Filed Vitals:   03/07/14 0937  Height: 5\' 7"  (1.702 m)  Weight: 199 lb 9.6 oz (90.538 kg)   Body mass index is 31.25 kg/(m^2). Ideal Body Weight: Weight in (lb) to have BMI = 25: 159.3  GEN: WDWN, NAD, Non-toxic, A & O x 3, overweight, looks well HEENT: Atraumatic, Normocephalic. Neck supple. No masses, No LAD.  Bilateral TM wnl, oropharynx normal.  PEERL,EOMI.  Nasal congestions  Ears and Nose: No external deformity. CV: RRR, No M/G/R. No JVD. No thrill. No extra heart sounds. PULM: CTA B, no wheezes, crackles, rhonchi. No retractions. No resp. distress. No accessory muscle use. EXTR: No c/c/e NEURO Normal gait.  PSYCH: Normally interactive. Conversant. Not depressed or anxious appearing.  Calm demeanor.    Assessment and Plan: Acute frontal sinusitis, recurrence not specified - Plan: cefdinir (OMNICEF) 300 MG capsule  Hypothyroidism (acquired)  Cough -  Plan: HYDROcodone-homatropine (HYCODAN) 5-1.5 MG/5ML syrup  Cough and sinusitis.  Treat with omnicef and hycodan.   See patient instructions for more details.     Signed Lamar Blinks, MD

## 2014-03-12 ENCOUNTER — Other Ambulatory Visit: Payer: Self-pay | Admitting: Obstetrics and Gynecology

## 2014-03-12 DIAGNOSIS — Z1231 Encounter for screening mammogram for malignant neoplasm of breast: Secondary | ICD-10-CM

## 2014-04-16 ENCOUNTER — Ambulatory Visit (HOSPITAL_COMMUNITY)
Admission: RE | Admit: 2014-04-16 | Discharge: 2014-04-16 | Disposition: A | Discharge status: Home / Self Care | Payer: 59 | Source: Ambulatory Visit | Attending: Obstetrics and Gynecology | Admitting: Obstetrics and Gynecology

## 2014-04-16 DIAGNOSIS — Z1231 Encounter for screening mammogram for malignant neoplasm of breast: Secondary | ICD-10-CM | POA: Diagnosis not present

## 2014-07-07 ENCOUNTER — Other Ambulatory Visit: Payer: Self-pay | Admitting: Endodontics

## 2014-09-16 ENCOUNTER — Other Ambulatory Visit (HOSPITAL_COMMUNITY): Payer: Self-pay | Admitting: Sports Medicine

## 2014-09-16 DIAGNOSIS — M79662 Pain in left lower leg: Principal | ICD-10-CM

## 2014-09-16 DIAGNOSIS — M79661 Pain in right lower leg: Secondary | ICD-10-CM

## 2014-09-17 ENCOUNTER — Encounter (HOSPITAL_COMMUNITY): Payer: Self-pay

## 2014-09-22 ENCOUNTER — Ambulatory Visit (HOSPITAL_COMMUNITY)
Admission: RE | Admit: 2014-09-22 | Discharge: 2014-09-22 | Disposition: A | Discharge status: Home / Self Care | Payer: 59 | Source: Ambulatory Visit | Attending: Cardiovascular Disease | Admitting: Cardiovascular Disease

## 2014-09-22 DIAGNOSIS — M79661 Pain in right lower leg: Secondary | ICD-10-CM | POA: Diagnosis present

## 2014-09-22 DIAGNOSIS — M79662 Pain in left lower leg: Secondary | ICD-10-CM | POA: Diagnosis not present

## 2014-09-22 DIAGNOSIS — M7989 Other specified soft tissue disorders: Secondary | ICD-10-CM

## 2014-09-22 NOTE — Progress Notes (Signed)
Right lower Ext. Venous Duplex Completed. Negative for DVT or SVT. Oda Cogan, BS, RDMS, RVT

## 2014-10-02 ENCOUNTER — Telehealth (HOSPITAL_COMMUNITY): Payer: Self-pay | Admitting: *Deleted

## 2015-10-14 ENCOUNTER — Ambulatory Visit (INDEPENDENT_AMBULATORY_CARE_PROVIDER_SITE_OTHER): Payer: 59 | Admitting: Emergency Medicine

## 2015-10-14 VITALS — BP 120/70 | HR 70 | Temp 97.6°F | Resp 20 | Ht 67.5 in | Wt 212.8 lb

## 2015-10-14 DIAGNOSIS — R9431 Abnormal electrocardiogram [ECG] [EKG]: Secondary | ICD-10-CM | POA: Diagnosis not present

## 2015-10-14 DIAGNOSIS — R1013 Epigastric pain: Secondary | ICD-10-CM

## 2015-10-14 DIAGNOSIS — K30 Functional dyspepsia: Secondary | ICD-10-CM

## 2015-10-14 DIAGNOSIS — R0789 Other chest pain: Secondary | ICD-10-CM | POA: Diagnosis not present

## 2015-10-14 MED ORDER — NITROGLYCERIN 0.4 MG SL SUBL: 0.4000 mg | SUBLINGUAL_TABLET | SUBLINGUAL | Status: DC | PRN

## 2015-10-14 MED ORDER — OMEPRAZOLE 20 MG PO CPDR: 20.0000 mg | DELAYED_RELEASE_CAPSULE | Freq: Every day | ORAL | Status: DC

## 2015-10-14 NOTE — Progress Notes (Signed)
   Subjective:    Patient ID: Colleen Carter, female    DOB: 04-Aug-1961, 55 y.o.   MRN: [id number]  HPI This is a pleasant 55 yo female who presents today with 3 weeks of intermittent (about every other day) pain that started in her mid sternum with an occasional ache. It has moved around her arms. Pain worse with lying down. Pain after having caffeine. Eats prior to going to bed. Pain lasts while she is moving around, feels like pressure on her chest. She took some mylanta last night while at work which helped her symptoms. No diaphoresis, no fatigue, occasional nausea. Runs machines at work, occasional heavy lifting. She gets pain relief with position change. Exercises 3 times a week and does not have symptoms with exercise.   Sees Dr. Suzette Battiest for hypothyroidism.   History reviewed. No pertinent past medical history. History reviewed. No pertinent past surgical history. Family History  Problem Relation Age of Onset  . Diabetes Sister   . Stroke Brother    Social History  Substance Use Topics  . Smoking status: Former Research scientist (life sciences)  . Smokeless tobacco: None  . Alcohol Use: None    Review of Systems  Constitutional: Negative for fever and fatigue.  Respiratory: Negative for cough, chest tightness, shortness of breath and wheezing.   Cardiovascular: Positive for chest pain.  Gastrointestinal: Positive for nausea (x1). Negative for vomiting, diarrhea and constipation.  Musculoskeletal: Positive for myalgias (pain in upper chest and shoulders. ).  Neurological: Negative for headaches.       Objective:   Physical Exam  Constitutional: She is oriented to person, place, and time. She appears well-developed and well-nourished. No distress.  HENT:  Head: Normocephalic and atraumatic.  Eyes: Conjunctivae are normal.  Cardiovascular: Normal rate, regular rhythm and normal heart sounds.   Pulmonary/Chest: Effort normal and breath sounds normal.  Abdominal: Soft. Bowel sounds are normal. She  exhibits no distension and no mass. There is no tenderness. There is no rebound and no guarding.  Musculoskeletal: Normal range of motion.  Neurological: She is alert and oriented to person, place, and time.  Skin: Skin is warm. She is not diaphoretic.  Vitals reviewed.  BP 120/70 mmHg  Pulse 70  Temp(Src) 97.6 F (36.4 C) (Oral)  Resp 20  Ht 5' 7.5" (1.715 m)  Wt 212 lb 12.8 oz (96.525 kg)  BMI 32.82 kg/m2  SpO2 97% Wt Readings from Last 3 Encounters:  10/14/15 212 lb 12.8 oz (96.525 kg)  03/07/14 199 lb 9.6 oz (90.538 kg)  04/13/12 218 lb (98.884 kg)   EKG- reviewed with Dr. Everlene Farrier- Joint elevation, suspect benign early repolarization.     Assessment & Plan:  1. Chest pressure - with abnormal EKG will refer to cardiology, discussed with patient, advised her to start aspirin 81 mg and avoid heavy lifting - EKG 12-Lead - Ambulatory referral to Cardiology - nitroGLYCERIN (NITROSTAT) 0.4 MG SL tablet; Place 1 tablet (0.4 mg total) under the tongue every 5 (five) minutes as needed for chest pain.  Dispense: 50 tablet; Refill: 3 - Go to ED if severe pain, radiation to arm/neck, SOB  2. Indigestion - omeprazole (PRILOSEC) 20 MG capsule; Take 1 capsule (20 mg total) by mouth daily.  Dispense: 30 capsule; Refill: 1  3. Nonspecific abnormal electrocardiogram (ECG) (EKG) - Ambulatory referral to Cardiology  - follow up in 1 month Clarene Reamer, FNP-BC  [facility]  10/14/2015 12:07 PM

## 2015-10-14 NOTE — Patient Instructions (Addendum)
Please start a daily low dose (81 mg) aspirin We will call you about an appointment with the cardiologist Please avoid heavy lifting Nitroglycerin sublingual tablets What is this medicine? NITROGLYCERIN (nye troe GLI ser in) is a type of vasodilator. It relaxes blood vessels, increasing the blood and oxygen supply to your heart. This medicine is used to relieve chest pain caused by angina. It is also used to prevent chest pain before activities like climbing stairs, going outdoors in cold weather, or sexual activity. This medicine may be used for other purposes; ask your health care provider or pharmacist if you have questions. What should I tell my health care provider before I take this medicine? They need to know if you have any of these conditions: -anemia -head injury, recent stroke, or bleeding in the brain -liver disease -previous heart attack -an unusual or allergic reaction to nitroglycerin, other medicines, foods, dyes, or preservatives -pregnant or trying to get pregnant -breast-feeding How should I use this medicine? Take this medicine by mouth as needed. At the first sign of an angina attack (chest pain or tightness) place one tablet under your tongue. You can also take this medicine 5 to 10 minutes before an event likely to produce chest pain. Follow the directions on the prescription label. Let the tablet dissolve under the tongue. Do not swallow whole. Replace the dose if you accidentally swallow it. It will help if your mouth is not dry. Saliva around the tablet will help it to dissolve more quickly. Do not eat or drink, smoke or chew tobacco while a tablet is dissolving. If you are not better within 5 minutes after taking ONE dose of nitroglycerin, call 9-1-1 immediately to seek emergency medical care. Do not take more than 3 nitroglycerin tablets over 15 minutes. If you take this medicine often to relieve symptoms of angina, your doctor or health care professional may provide you  with different instructions to manage your symptoms. If symptoms do not go away after following these instructions, it is important to call 9-1-1 immediately. Do not take more than 3 nitroglycerin tablets over 15 minutes. Talk to your pediatrician regarding the use of this medicine in children. Special care may be needed. Overdosage: If you think you have taken too much of this medicine contact a poison control center or emergency room at once. NOTE: This medicine is only for you. Do not share this medicine with others. What if I miss a dose? This does not apply. This medicine is only used as needed. What may interact with this medicine? Do not take this medicine with any of the following medications: -certain migraine medicines like ergotamine and dihydroergotamine (DHE) -medicines used to treat erectile dysfunction like sildenafil, tadalafil, and vardenafil -riociguat This medicine may also interact with the following medications: -alteplase -aspirin -heparin -medicines for high blood pressure -medicines for mental depression -other medicines used to treat angina -phenothiazines like chlorpromazine, mesoridazine, prochlorperazine, thioridazine This list may not describe all possible interactions. Give your health care provider a list of all the medicines, herbs, non-prescription drugs, or dietary supplements you use. Also tell them if you smoke, drink alcohol, or use illegal drugs. Some items may interact with your medicine. What should I watch for while using this medicine? Tell your doctor or health care professional if you feel your medicine is no longer working. Keep this medicine with you at all times. Sit or lie down when you take your medicine to prevent falling if you feel dizzy or faint after  using it. Try to remain calm. This will help you to feel better faster. If you feel dizzy, take several deep breaths and lie down with your feet propped up, or bend forward with your head  resting between your knees. You may get drowsy or dizzy. Do not drive, use machinery, or do anything that needs mental alertness until you know how this drug affects you. Do not stand or sit up quickly, especially if you are an older patient. This reduces the risk of dizzy or fainting spells. Alcohol can make you more drowsy and dizzy. Avoid alcoholic drinks. Do not treat yourself for coughs, colds, or pain while you are taking this medicine without asking your doctor or health care professional for advice. Some ingredients may increase your blood pressure. What side effects may I notice from receiving this medicine? Side effects that you should report to your doctor or health care professional as soon as possible: -blurred vision -dry mouth -skin rash -sweating -the feeling of extreme pressure in the head -unusually weak or tired Side effects that usually do not require medical attention (report to your doctor or health care professional if they continue or are bothersome): -flushing of the face or neck -headache -irregular heartbeat, palpitations -nausea, vomiting This list may not describe all possible side effects. Call your doctor for medical advice about side effects. You may report side effects to FDA at 1-800-FDA-1088. Where should I keep my medicine? Keep out of the reach of children. Store at room temperature between 20 and 25 degrees C (68 and 77 degrees F). Store in [employer]. Protect from light and moisture. Keep tightly closed. Throw away any unused medicine after the expiration date. NOTE: This sheet is a summary. It may not cover all possible information. If you have questions about this medicine, talk to your doctor, pharmacist, or health care provider.    2016, Elsevier/Gold Standard. (2013-06-20 17:57:36)

## 2015-11-17 ENCOUNTER — Ambulatory Visit: Payer: 59 | Admitting: Family Medicine

## 2015-11-20 ENCOUNTER — Encounter (HOSPITAL_BASED_OUTPATIENT_CLINIC_OR_DEPARTMENT_OTHER): Payer: Self-pay | Admitting: *Deleted

## 2015-11-23 ENCOUNTER — Other Ambulatory Visit: Payer: Self-pay | Admitting: Orthopedic Surgery

## 2015-11-24 ENCOUNTER — Ambulatory Visit (INDEPENDENT_AMBULATORY_CARE_PROVIDER_SITE_OTHER): Payer: 59 | Admitting: Family Medicine

## 2015-11-24 VITALS — BP 112/74 | HR 83 | Temp 98.3°F | Resp 16 | Ht 67.5 in | Wt 213.0 lb

## 2015-11-24 DIAGNOSIS — M5431 Sciatica, right side: Secondary | ICD-10-CM | POA: Diagnosis not present

## 2015-11-24 DIAGNOSIS — M79604 Pain in right leg: Secondary | ICD-10-CM

## 2015-11-24 DIAGNOSIS — M7061 Trochanteric bursitis, right hip: Secondary | ICD-10-CM | POA: Diagnosis not present

## 2015-11-24 MED ORDER — HYDROCODONE-ACETAMINOPHEN 5-325 MG PO TABS: 1.0000 | ORAL_TABLET | Freq: Four times a day (QID) | ORAL | Status: DC | PRN

## 2015-11-24 MED ORDER — CYCLOBENZAPRINE HCL 5 MG PO TABS: ORAL_TABLET | ORAL | Status: DC

## 2015-11-24 NOTE — Patient Instructions (Signed)
Your exam suggests possible combination of trochanteric bursitis, as well as sciatica on the right side. Relative rest, see more information below. Follow-up in the next 1-2 weeks if not improving as sometimes an injection can help the hip pain, and we may need to check x-rays at that time. Return sooner if worse.  As you have surgery coming up in a few days, we'll hold off on anti-inflammatories at this time. You can take Flexeril (muscle relaxant) up to every 8 hours as needed, but this does cause sedation. If needed for pain, I did prescribe some hydrocodone, but this also can cause sedation, so be careful combining this with the Flexeril.  Return to the clinic or go to the nearest emergency room if any of your symptoms worsen or new symptoms occur.   Sciatica Sciatica is pain, weakness, numbness, or tingling along the path of the sciatic nerve. The nerve starts in the lower back and runs down the back of each leg. The nerve controls the muscles in the lower leg and in the back of the knee, while also providing sensation to the back of the thigh, lower leg, and the sole of your foot. Sciatica is a symptom of another medical condition. For instance, nerve damage or certain conditions, such as a herniated disk or bone spur on the spine, pinch or put pressure on the sciatic nerve. This causes the pain, weakness, or other sensations normally associated with sciatica. Generally, sciatica only affects one side of the body. CAUSES   Herniated or slipped disc.  Degenerative disk disease.  A pain disorder involving the narrow muscle in the buttocks (piriformis syndrome).  Pelvic injury or fracture.  Pregnancy.  Tumor (rare). SYMPTOMS  Symptoms can vary from mild to very severe. The symptoms usually travel from the low back to the buttocks and down the back of the leg. Symptoms can include:  Mild tingling or dull aches in the lower back, leg, or hip.  Numbness in the back of the calf or sole of  the foot.  Burning sensations in the lower back, leg, or hip.  Sharp pains in the lower back, leg, or hip.  Leg weakness.  Severe back pain inhibiting movement. These symptoms may get worse with coughing, sneezing, laughing, or prolonged sitting or standing. Also, being overweight may worsen symptoms. DIAGNOSIS  Your caregiver will perform a physical exam to look for common symptoms of sciatica. He or she may ask you to do certain movements or activities that would trigger sciatic nerve pain. Other tests may be performed to find the cause of the sciatica. These may include:  Blood tests.  X-rays.  Imaging tests, such as an MRI or CT scan. TREATMENT  Treatment is directed at the cause of the sciatic pain. Sometimes, treatment is not necessary and the pain and discomfort goes away on its own. If treatment is needed, your caregiver may suggest:  Over-the-counter medicines to relieve pain.  Prescription medicines, such as anti-inflammatory medicine, muscle relaxants, or narcotics.  Applying heat or ice to the painful area.  Steroid injections to lessen pain, irritation, and inflammation around the nerve.  Reducing activity during periods of pain.  Exercising and stretching to strengthen your abdomen and improve flexibility of your spine. Your caregiver may suggest losing weight if the extra weight makes the back pain worse.  Physical therapy.  Surgery to eliminate what is pressing or pinching the nerve, such as a bone spur or part of a herniated disk. HOME CARE INSTRUCTIONS  Only take over-the-counter or prescription medicines for pain or discomfort as directed by your caregiver.  Apply ice to the affected area for 20 minutes, 3-4 times a day for the first 48-72 hours. Then try heat in the same way.  Exercise, stretch, or perform your usual activities if these do not aggravate your pain.  Attend physical therapy sessions as directed by your caregiver.  Keep all follow-up  appointments as directed by your caregiver.  Do not wear high heels or shoes that do not provide proper support.  Check your mattress to see if it is too soft. A firm mattress may lessen your pain and discomfort. SEEK IMMEDIATE MEDICAL CARE IF:   You lose control of your bowel or bladder (incontinence).  You have increasing weakness in the lower back, pelvis, buttocks, or legs.  You have redness or swelling of your back.  You have a burning sensation when you urinate.  You have pain that gets worse when you lie down or awakens you at night.  Your pain is worse than you have experienced in the past.  Your pain is lasting longer than 4 weeks.  You are suddenly losing weight without reason. MAKE SURE YOU:  Understand these instructions.  Will watch your condition.  Will get help right away if you are not doing well or get worse.   This information is not intended to replace advice given to you by your health care provider. Make sure you discuss any questions you have with your health care provider.   Document Released: 08/16/2001 Document Revised: 05/13/2015 Document Reviewed: 01/01/2012 Elsevier Interactive Patient Education 2016 [employer]   Trochanteric Bursitis You have hip pain due to trochanteric bursitis. Bursitis means that the sack near the outside of the hip is filled with fluid and inflamed. This sack is made up of protective soft tissue. The pain from trochanteric bursitis can be severe and keep you from sleep. It can radiate to the buttocks or down the outside of the thigh to the knee. The pain is almost always worse when rising from the seated or lying position and with walking. Pain can improve after you take a few steps. It happens more often in people with hip joint and lumbar spine problems, such as arthritis or previous surgery. Very rarely the trochanteric bursa can become infected, and antibiotics and/or surgery may be needed. Treatment often includes an  injection of local anesthetic mixed with cortisone medicine. This medicine is injected into the area where it is most tender over the hip. Repeat injections may be necessary if the response to treatment is slow. You can apply ice packs over the tender area for 30 minutes every 2 hours for the next few days. Anti-inflammatory and/or narcotic pain medicine may also be helpful. Limit your activity for the next few days if the pain continues. See your caregiver in 5-10 days if you are not greatly improved.  SEEK IMMEDIATE MEDICAL CARE IF:  You develop severe pain, fever, or increased redness.  You have pain that radiates below the knee. EXERCISES STRETCHING EXERCISES - Trochanteric Bursitis  These exercises may help you when beginning to rehabilitate your injury. Your symptoms may resolve with or without further involvement from your physician, physical therapist, or athletic trainer. While completing these exercises, remember:   Restoring tissue flexibility helps normal motion to return to the joints. This allows healthier, less painful movement and activity.  An effective stretch should be held for at least 30 seconds.  A stretch should  never be painful. You should only feel a gentle lengthening or release in the stretched tissue. STRETCH - Iliotibial Band  On the floor or bed, lie on your side so your injured leg is on top. Bend your knee and grab your ankle.  Slowly bring your knee back so that your thigh is in line with your trunk. Keep your heel at your buttocks and gently arch your back so your head, shoulders and hips line up.  Slowly lower your leg so that your knee approaches the floor/bed until you feel a gentle stretch on the outside of your thigh. If you do not feel a stretch and your knee will not fall farther, place the heel of your opposite foot on top of your knee and pull your thigh down farther.  Hold this stretch for __________ seconds.  Repeat __________ times. Complete this  exercise __________ times per day. STRETCH - Hamstrings, Supine   Lie on your back. Loop a belt or towel over the ball of your foot as shown.  Straighten your knee and slowly pull on the belt to raise your injured leg. Do not allow the knee to bend. Keep your opposite leg flat on the floor.  Raise the leg until you feel a gentle stretch behind your knee or thigh. Hold this position for __________ seconds.  Repeat __________ times. Complete this stretch __________ times per day. STRETCH - Quadriceps, Prone   Lie on your stomach on a firm surface, such as a bed or padded floor.  Bend your knee and grasp your ankle. If you are unable to reach your ankle or pant leg, use a belt around your foot to lengthen your reach.  Gently pull your heel toward your buttocks. Your knee should not slide out to the side. You should feel a stretch in the front of your thigh and/or knee.  Hold this position for __________ seconds.  Repeat __________ times. Complete this stretch __________ times per day. STRETCHING - Hip Flexors, Lunge Half kneel with your knee on the floor and your opposite knee bent and directly over your ankle.  Keep good posture with your head over your shoulders. Tighten your buttocks to point your tailbone downward; this will prevent your back from arching too much.  You should feel a gentle stretch in the front of your thigh and/or hip. If you do not feel any resistance, slightly slide your opposite foot forward and then slowly lunge forward so your knee once again lines up over your ankle. Be sure your tailbone remains pointed downward.  Hold this stretch for __________ seconds.  Repeat __________ times. Complete this stretch __________ times per day. STRETCH - Adductors, Lunge  While standing, spread your legs.  Lean away from your injured leg by bending your opposite knee. You may rest your hands on your thigh for balance.  You should feel a stretch in your inner thigh. Hold  for __________ seconds.  Repeat __________ times. Complete this exercise __________ times per day.   This information is not intended to replace advice given to you by your health care provider. Make sure you discuss any questions you have with your health care provider.   Document Released: 09/29/2004 Document Revised: 01/06/2015 Document Reviewed: 12/04/2008 Elsevier Interactive Patient Education [employer].

## 2015-11-24 NOTE — Progress Notes (Signed)
Subjective:  By signing my name below, I, Moises Blood, attest that this documentation has been prepared under the direction and in the presence of Merri Ray, MD. Electronically Signed: Moises Blood, [place]. 11/24/2015 , 5:37 PM .  Patient was seen in Room 6 .   Patient ID: Colleen Carter, female    DOB: 1961-07-07, 55 y.o.   MRN: [id number] Chief Complaint  Patient presents with  . Leg Pain    right leg 1 x week   HPI Colleen Carter is a 55 y.o. female Pt is here due to right leg pain that started about 6 days ago. She states that she was exercising on the trampoline and also on the spin bike. She felt soreness over the outside of her right hip down to her right foot the next day. She mentions not warming up before or stretching after her exercises. She denies any injuries. She denies any urinary symptoms, bowel symptoms, any weakness, back pain, or calf swelling. She denies history of blood clots. She denies long distance travel via car or airplane. She denies using a cane for ambulation.   She had sciatic nerve issues on the left side a few years ago.   She mentions having surgery for carpal tunnel in 3 days.  She works on Investment banker, operational.   Patient Active Problem List   Diagnosis Date Noted  . Hypothyroidism (acquired) 03/07/2014  . Skin ulcer ([place]) 04/03/2012   Past Medical History  Diagnosis Date  . Hypothyroidism   . GERD (gastroesophageal reflux disease)   . Anginal pain ([place])     turned out to be muscular  . Carpal tunnel syndrome    Past Surgical History  Procedure Laterality Date  . Tubal ligation    . Breast surgery      breast reduction  . Total thyroidectomy    . Carpal tunnel release Right    No Known Allergies Prior to Admission medications   Medication Sig Start Date End Date Taking? Authorizing Provider  Levothyroxine Sodium (SYNTHROID PO) Take 1 mg by mouth.   Yes Historical Provider, MD  calcium carbonate 200 MG capsule Take 250 mg by mouth 2  (two) times daily with a meal. Reported on 11/24/2015    Historical Provider, MD  cholecalciferol (VITAMIN D) 1000 UNITS tablet Take 1,000 Units by mouth daily. Reported on 11/24/2015    Historical Provider, MD  Multiple Vitamins-Minerals (MULTIVITAMIN WITH MINERALS) tablet Take 1 tablet by mouth daily. Reported on 11/24/2015    Historical Provider, MD  nitroGLYCERIN (NITROSTAT) 0.4 MG SL tablet Place 1 tablet (0.4 mg total) under the tongue every 5 (five) minutes as needed for chest pain. Patient not taking: Reported on 11/24/2015 10/14/15   Elby Beck, FNP   Social History   Social History  . Marital Status: Divorced    Spouse Name: N/A  . Number of Children: N/A  . Years of Education: N/A   Occupational History  . Not on file.   Social History Main Topics  . Smoking status: Former Research scientist (life sciences)  . Smokeless tobacco: Not on file  . Alcohol Use: Yes     Comment: social  . Drug Use: No  . Sexual Activity: Yes    Birth Control/ Protection: Surgical   Other Topics Concern  . Not on file   Social History Narrative   Review of Systems  Cardiovascular: Negative for leg swelling.  Gastrointestinal: Negative for nausea, vomiting, diarrhea and blood in stool.  Genitourinary: Negative for dysuria,  urgency, frequency, hematuria and difficulty urinating.  Musculoskeletal: Positive for myalgias and gait problem. Negative for back pain and joint swelling.  Skin: Negative for rash and wound.  Neurological: Negative for weakness and numbness.       Objective:   Physical Exam  Constitutional: She is oriented to person, place, and time. She appears well-developed and well-nourished. No distress.  HENT:  Head: Normocephalic and atraumatic.  Eyes: EOM are normal. Pupils are equal, round, and reactive to light.  Neck: Neck supple.  Cardiovascular: Normal rate.   Pulmonary/Chest: Effort normal. No respiratory distress.  Musculoskeletal: Normal range of motion.  Right leg: flexion intact; no  focal tenderness with flexion; reproduction of right calf pain with extension, right side flexion also reproduced symptoms; left and right rotations intact, able to heel-toe walk without difficulty; sciatic and SI joint non tender, tender over trochanteric bursa; slight discomfort into right calf with seated straight leg raise, no apparent edema in lower extremities, negative homan's  Neurological: She is alert and oriented to person, place, and time. She displays no Babinski's sign on the right side. She displays no Babinski's sign on the left side.  Reflex Scores:      Patellar reflexes are 2+ on the right side and 2+ on the left side.      Achilles reflexes are 2+ on the right side and 2+ on the left side. Skin: Skin is warm and dry.  Psychiatric: She has a normal mood and affect. Her behavior is normal.  Nursing note and vitals reviewed.   Filed Vitals:   11/24/15 1636  BP: 112/74  Pulse: 83  Temp: 98.3 F (36.8 C)  TempSrc: Oral  Resp: 16  Height: 5' 7.5" (1.715 m)  Weight: 213 lb (96.616 kg)  SpO2: 96%      Assessment & Plan:   Colleen Carter is a 55 y.o. female Right sided sciatica - Plan: HYDROcodone-acetaminophen (NORCO/VICODIN) 5-325 MG tablet, cyclobenzaprine (FLEXERIL) 5 MG tablet  Right leg pain - Plan: HYDROcodone-acetaminophen (NORCO/VICODIN) 5-325 MG tablet, cyclobenzaprine (FLEXERIL) 5 MG tablet  Trochanteric bursitis of right hip - Plan: HYDROcodone-acetaminophen (NORCO/VICODIN) 5-325 MG tablet, cyclobenzaprine (FLEXERIL) 5 MG tablet  Suspect accommodation of trochanteric bursitis and right sided sciatica. No weakness or red flags on exam. Does not appear to have any primary hip pain on exam.   -Due to upcoming surgery, decided against anti-inflammatories at this time with risk of bleeding.  -Flexeril 5 mg every 8 hours, start at bedtime due to sedation. Side effects discussed.  -hydrocodone if needed for breakthrough pain, side effects discussed, and combination  precautions discussed with Flexeril.  -if not improving the next 1-2 weeks, consider x-ray or possible prednisone taper at that time. If trochanteric bursitis persistent at that time, consider injection. Meds ordered this encounter  Medications  . HYDROcodone-acetaminophen (NORCO/VICODIN) 5-325 MG tablet    Sig: Take 1 tablet by mouth every 6 (six) hours as needed for moderate pain.    Dispense:  15 tablet    Refill:  0  . cyclobenzaprine (FLEXERIL) 5 MG tablet    Sig: 1 pill by mouth up to every 8 hours as needed. Start with one pill by mouth each bedtime as needed due to sedation    Dispense:  15 tablet    Refill:  0   Patient Instructions  Your exam suggests possible combination of trochanteric bursitis, as well as sciatica on the right side. Relative rest, see more information below. Follow-up in the next 1-2 weeks  if not improving as sometimes an injection can help the hip pain, and we may need to check x-rays at that time. Return sooner if worse.  As you have surgery coming up in a few days, we'll hold off on anti-inflammatories at this time. You can take Flexeril (muscle relaxant) up to every 8 hours as needed, but this does cause sedation. If needed for pain, I did prescribe some hydrocodone, but this also can cause sedation, so be careful combining this with the Flexeril.  Return to the clinic or go to the nearest emergency room if any of your symptoms worsen or new symptoms occur.   Sciatica Sciatica is pain, weakness, numbness, or tingling along the path of the sciatic nerve. The nerve starts in the lower back and runs down the back of each leg. The nerve controls the muscles in the lower leg and in the back of the knee, while also providing sensation to the back of the thigh, lower leg, and the sole of your foot. Sciatica is a symptom of another medical condition. For instance, nerve damage or certain conditions, such as a herniated disk or bone spur on the spine, pinch or put  pressure on the sciatic nerve. This causes the pain, weakness, or other sensations normally associated with sciatica. Generally, sciatica only affects one side of the body. CAUSES   Herniated or slipped disc.  Degenerative disk disease.  A pain disorder involving the narrow muscle in the buttocks (piriformis syndrome).  Pelvic injury or fracture.  Pregnancy.  Tumor (rare). SYMPTOMS  Symptoms can vary from mild to very severe. The symptoms usually travel from the low back to the buttocks and down the back of the leg. Symptoms can include:  Mild tingling or dull aches in the lower back, leg, or hip.  Numbness in the back of the calf or sole of the foot.  Burning sensations in the lower back, leg, or hip.  Sharp pains in the lower back, leg, or hip.  Leg weakness.  Severe back pain inhibiting movement. These symptoms may get worse with coughing, sneezing, laughing, or prolonged sitting or standing. Also, being overweight may worsen symptoms. DIAGNOSIS  Your caregiver will perform a physical exam to look for common symptoms of sciatica. He or she may ask you to do certain movements or activities that would trigger sciatic nerve pain. Other tests may be performed to find the cause of the sciatica. These may include:  Blood tests.  X-rays.  Imaging tests, such as an MRI or CT scan. TREATMENT  Treatment is directed at the cause of the sciatic pain. Sometimes, treatment is not necessary and the pain and discomfort goes away on its own. If treatment is needed, your caregiver may suggest:  Over-the-counter medicines to relieve pain.  Prescription medicines, such as anti-inflammatory medicine, muscle relaxants, or narcotics.  Applying heat or ice to the painful area.  Steroid injections to lessen pain, irritation, and inflammation around the nerve.  Reducing activity during periods of pain.  Exercising and stretching to strengthen your abdomen and improve flexibility of your  spine. Your caregiver may suggest losing weight if the extra weight makes the back pain worse.  Physical therapy.  Surgery to eliminate what is pressing or pinching the nerve, such as a bone spur or part of a herniated disk. HOME CARE INSTRUCTIONS   Only take over-the-counter or prescription medicines for pain or discomfort as directed by your caregiver.  Apply ice to the affected area for 20 minutes, 3-4  times a day for the first 48-72 hours. Then try heat in the same way.  Exercise, stretch, or perform your usual activities if these do not aggravate your pain.  Attend physical therapy sessions as directed by your caregiver.  Keep all follow-up appointments as directed by your caregiver.  Do not wear high heels or shoes that do not provide proper support.  Check your mattress to see if it is too soft. A firm mattress may lessen your pain and discomfort. SEEK IMMEDIATE MEDICAL CARE IF:   You lose control of your bowel or bladder (incontinence).  You have increasing weakness in the lower back, pelvis, buttocks, or legs.  You have redness or swelling of your back.  You have a burning sensation when you urinate.  You have pain that gets worse when you lie down or awakens you at night.  Your pain is worse than you have experienced in the past.  Your pain is lasting longer than 4 weeks.  You are suddenly losing weight without reason. MAKE SURE YOU:  Understand these instructions.  Will watch your condition.  Will get help right away if you are not doing well or get worse.   This information is not intended to replace advice given to you by your health care provider. Make sure you discuss any questions you have with your health care provider.   Document Released: 08/16/2001 Document Revised: 05/13/2015 Document Reviewed: 01/01/2012 Elsevier Interactive Patient Education 2016 Else[employer]   Trochanteric Bursitis You have hip pain due to trochanteric bursitis. Bursitis  means that the sack near the outside of the hip is filled with fluid and inflamed. This sack is made up of protective soft tissue. The pain from trochanteric bursitis can be severe and keep you from sleep. It can radiate to the buttocks or down the outside of the thigh to the knee. The pain is almost always worse when rising from the seated or lying position and with walking. Pain can improve after you take a few steps. It happens more often in people with hip joint and lumbar spine problems, such as arthritis or previous surgery. Very rarely the trochanteric bursa can become infected, and antibiotics and/or surgery may be needed. Treatment often includes an injection of local anesthetic mixed with cortisone medicine. This medicine is injected into the area where it is most tender over the hip. Repeat injections may be necessary if the response to treatment is slow. You can apply ice packs over the tender area for 30 minutes every 2 hours for the next few days. Anti-inflammatory and/or narcotic pain medicine may also be helpful. Limit your activity for the next few days if the pain continues. See your caregiver in 5-10 days if you are not greatly improved.  SEEK IMMEDIATE MEDICAL CARE IF:  You develop severe pain, fever, or increased redness.  You have pain that radiates below the knee. EXERCISES STRETCHING EXERCISES - Trochanteric Bursitis  These exercises may help you when beginning to rehabilitate your injury. Your symptoms may resolve with or without further involvement from your physician, physical therapist, or athletic trainer. While completing these exercises, remember:   Restoring tissue flexibility helps normal motion to return to the joints. This allows healthier, less painful movement and activity.  An effective stretch should be held for at least 30 seconds.  A stretch should never be painful. You should only feel a gentle lengthening or release in the stretched tissue. STRETCH -  Iliotibial Band  On the floor or bed,  lie on your side so your injured leg is on top. Bend your knee and grab your ankle.  Slowly bring your knee back so that your thigh is in line with your trunk. Keep your heel at your buttocks and gently arch your back so your head, shoulders and hips line up.  Slowly lower your leg so that your knee approaches the floor/bed until you feel a gentle stretch on the outside of your thigh. If you do not feel a stretch and your knee will not fall farther, place the heel of your opposite foot on top of your knee and pull your thigh down farther.  Hold this stretch for __________ seconds.  Repeat __________ times. Complete this exercise __________ times per day. STRETCH - Hamstrings, Supine   Lie on your back. Loop a belt or towel over the ball of your foot as shown.  Straighten your knee and slowly pull on the belt to raise your injured leg. Do not allow the knee to bend. Keep your opposite leg flat on the floor.  Raise the leg until you feel a gentle stretch behind your knee or thigh. Hold this position for __________ seconds.  Repeat __________ times. Complete this stretch __________ times per day. STRETCH - Quadriceps, Prone   Lie on your stomach on a firm surface, such as a bed or padded floor.  Bend your knee and grasp your ankle. If you are unable to reach your ankle or pant leg, use a belt around your foot to lengthen your reach.  Gently pull your heel toward your buttocks. Your knee should not slide out to the side. You should feel a stretch in the front of your thigh and/or knee.  Hold this position for __________ seconds.  Repeat __________ times. Complete this stretch __________ times per day. STRETCHING - Hip Flexors, Lunge Half kneel with your knee on the floor and your opposite knee bent and directly over your ankle.  Keep good posture with your head over your shoulders. Tighten your buttocks to point your tailbone downward; this will  prevent your back from arching too much.  You should feel a gentle stretch in the front of your thigh and/or hip. If you do not feel any resistance, slightly slide your opposite foot forward and then slowly lunge forward so your knee once again lines up over your ankle. Be sure your tailbone remains pointed downward.  Hold this stretch for __________ seconds.  Repeat __________ times. Complete this stretch __________ times per day. STRETCH - Adductors, Lunge  While standing, spread your legs.  Lean away from your injured leg by bending your opposite knee. You may rest your hands on your thigh for balance.  You should feel a stretch in your inner thigh. Hold for __________ seconds.  Repeat __________ times. Complete this exercise __________ times per day.   This information is not intended to replace advice given to you by your health care provider. Make sure you discuss any questions you have with your health care provider.   Document Released: 09/29/2004 Document Revised: 01/06/2015 Document Reviewed: 12/04/2008 Elsevier Interactive Patient Education [employer].       I personally performed the services described in this documentation, which was scribed in my presence. The recorded information has been reviewed and considered, and addended by me as needed.

## 2015-11-25 ENCOUNTER — Telehealth: Payer: Self-pay

## 2015-11-25 NOTE — Telephone Encounter (Signed)
Done, faxed

## 2015-11-25 NOTE — Telephone Encounter (Signed)
PATIENT WAS IN THE OFFICE AND SAW DR. Carlota Raspberry Wednesday FOR (R) LEG PAIN. HE WROTE HER A DOCTOR'S NOTE, BUT SHE CAN NOT DRIVE TO GIVE IT TO HER EMPLOYER. SHE WOULD LIKE TO GET THE NOTE FAXED. [facility] (ATTN) [facility]. (FAX) [phone]  IF QUESTIONS PLEASE CALL THE PATIENT AT [phone] (HOME)  MBC

## 2015-11-27 ENCOUNTER — Ambulatory Visit (HOSPITAL_BASED_OUTPATIENT_CLINIC_OR_DEPARTMENT_OTHER): Payer: 59 | Admitting: Anesthesiology

## 2015-11-27 ENCOUNTER — Ambulatory Visit (HOSPITAL_BASED_OUTPATIENT_CLINIC_OR_DEPARTMENT_OTHER)
Admission: RE | Admit: 2015-11-27 | Discharge: 2015-11-27 | Disposition: A | Discharge status: Home / Self Care | Payer: 59 | Source: Ambulatory Visit | Attending: Orthopedic Surgery | Admitting: Orthopedic Surgery

## 2015-11-27 ENCOUNTER — Encounter (HOSPITAL_BASED_OUTPATIENT_CLINIC_OR_DEPARTMENT_OTHER): Payer: Self-pay | Admitting: *Deleted

## 2015-11-27 ENCOUNTER — Encounter (HOSPITAL_BASED_OUTPATIENT_CLINIC_OR_DEPARTMENT_OTHER)
Admission: RE | Disposition: A | Discharge status: Home / Self Care | Payer: Self-pay | Source: Ambulatory Visit | Attending: Orthopedic Surgery

## 2015-11-27 DIAGNOSIS — Z79899 Other long term (current) drug therapy: Secondary | ICD-10-CM | POA: Insufficient documentation

## 2015-11-27 DIAGNOSIS — Z6833 Body mass index (BMI) 33.0-33.9, adult: Secondary | ICD-10-CM | POA: Insufficient documentation

## 2015-11-27 DIAGNOSIS — G5602 Carpal tunnel syndrome, left upper limb: Secondary | ICD-10-CM | POA: Insufficient documentation

## 2015-11-27 DIAGNOSIS — E039 Hypothyroidism, unspecified: Secondary | ICD-10-CM | POA: Insufficient documentation

## 2015-11-27 DIAGNOSIS — Z87891 Personal history of nicotine dependence: Secondary | ICD-10-CM | POA: Insufficient documentation

## 2015-11-27 DIAGNOSIS — K219 Gastro-esophageal reflux disease without esophagitis: Secondary | ICD-10-CM | POA: Insufficient documentation

## 2015-11-27 DIAGNOSIS — E669 Obesity, unspecified: Secondary | ICD-10-CM | POA: Insufficient documentation

## 2015-11-27 HISTORY — DX: Angina pectoris, unspecified: I20.9

## 2015-11-27 HISTORY — DX: Hypothyroidism, unspecified: E03.9

## 2015-11-27 HISTORY — DX: Gastro-esophageal reflux disease without esophagitis: K21.9

## 2015-11-27 HISTORY — DX: Carpal tunnel syndrome, unspecified upper limb: G56.00

## 2015-11-27 HISTORY — PX: CARPAL TUNNEL RELEASE: SHX101

## 2015-11-27 SURGERY — CARPAL TUNNEL RELEASE
Anesthesia: Monitor Anesthesia Care | Site: Wrist | Laterality: Left

## 2015-11-27 MED ORDER — SODIUM BICARBONATE 4 % IV SOLN
INTRAVENOUS | Status: DC | PRN
  Administered 2015-11-27: 20 mL via INTRAMUSCULAR

## 2015-11-27 MED ORDER — OXYCODONE HCL 5 MG PO TABS: 10.0000 mg | ORAL_TABLET | ORAL | Status: DC | PRN

## 2015-11-27 MED ORDER — MIDAZOLAM HCL 2 MG/2ML IJ SOLN
INTRAMUSCULAR | Status: AC
  Filled 2015-11-27: qty 2

## 2015-11-27 MED ORDER — SCOPOLAMINE 1 MG/3DAYS TD PT72: 1.0000 | MEDICATED_PATCH | Freq: Once | TRANSDERMAL | Status: DC | PRN

## 2015-11-27 MED ORDER — CHLORHEXIDINE GLUCONATE 4 % EX LIQD: 60.0000 mL | Freq: Once | CUTANEOUS | Status: DC

## 2015-11-27 MED ORDER — GLYCOPYRROLATE 0.2 MG/ML IJ SOLN: 0.2000 mg | Freq: Once | INTRAMUSCULAR | Status: DC | PRN

## 2015-11-27 MED ORDER — OXYCODONE HCL 5 MG/5ML PO SOLN: 5.0000 mg | Freq: Once | ORAL | Status: DC | PRN

## 2015-11-27 MED ORDER — DEXTROSE 5 % IV SOLN
2.0000 g | INTRAVENOUS | Status: AC
  Administered 2015-11-27: 2 g via INTRAVENOUS

## 2015-11-27 MED ORDER — PROMETHAZINE HCL 25 MG/ML IJ SOLN: 6.2500 mg | INTRAMUSCULAR | Status: DC | PRN

## 2015-11-27 MED ORDER — CEFAZOLIN SODIUM-DEXTROSE 2-4 GM/100ML-% IV SOLN
INTRAVENOUS | Status: AC
  Filled 2015-11-27: qty 100

## 2015-11-27 MED ORDER — LACTATED RINGERS IV SOLN
INTRAVENOUS | Status: DC
  Administered 2015-11-27: 07:00:00 via INTRAVENOUS

## 2015-11-27 MED ORDER — MIDAZOLAM HCL 2 MG/2ML IJ SOLN
1.0000 mg | INTRAMUSCULAR | Status: DC | PRN
  Administered 2015-11-27: 1 mg via INTRAVENOUS

## 2015-11-27 MED ORDER — FENTANYL CITRATE (PF) 100 MCG/2ML IJ SOLN
INTRAMUSCULAR | Status: AC
  Filled 2015-11-27: qty 2

## 2015-11-27 MED ORDER — OXYCODONE HCL 5 MG PO TABS: 5.0000 mg | ORAL_TABLET | Freq: Once | ORAL | Status: DC | PRN

## 2015-11-27 MED ORDER — FENTANYL CITRATE (PF) 100 MCG/2ML IJ SOLN
50.0000 ug | INTRAMUSCULAR | Status: DC | PRN
  Administered 2015-11-27: 50 ug via INTRAVENOUS

## 2015-11-27 MED ORDER — MEPERIDINE HCL 25 MG/ML IJ SOLN: 6.2500 mg | INTRAMUSCULAR | Status: DC | PRN

## 2015-11-27 MED ORDER — HYDROMORPHONE HCL 1 MG/ML IJ SOLN: 0.2500 mg | INTRAMUSCULAR | Status: DC | PRN

## 2015-11-27 SURGICAL SUPPLY — 49 items
BANDAGE ACE 3X5.8 VEL STRL LF (GAUZE/BANDAGES/DRESSINGS) ×3 IMPLANT
BLADE CARPAL TUNNEL SNGL USE (BLADE) ×3 IMPLANT
BLADE SURG 15 STRL LF DISP TIS (BLADE) ×2 IMPLANT
BLADE SURG 15 STRL SS (BLADE) ×6
BNDG CONFORM 3 STRL LF (GAUZE/BANDAGES/DRESSINGS) ×3 IMPLANT
BRUSH SCRUB EZ PLAIN DRY (MISCELLANEOUS) ×3 IMPLANT
CORDS BIPOLAR (ELECTRODE) ×3 IMPLANT
COVER BACK TABLE 60X90IN (DRAPES) ×3 IMPLANT
CUFF TOURNIQUET SINGLE 18IN (TOURNIQUET CUFF) ×2 IMPLANT
DRAIN PENROSE 1/4X12 LTX STRL (WOUND CARE) IMPLANT
DRAPE EXTREMITY T 121X128X90 (DRAPE) ×3 IMPLANT
DRAPE SURG 17X23 STRL (DRAPES) ×3 IMPLANT
DRSG EMULSION OIL 3X3 NADH (GAUZE/BANDAGES/DRESSINGS) ×3 IMPLANT
GAUZE SPONGE 4X4 12PLY STRL (GAUZE/BANDAGES/DRESSINGS) ×2 IMPLANT
GAUZE SPONGE 4X4 16PLY XRAY LF (GAUZE/BANDAGES/DRESSINGS) IMPLANT
GAUZE XEROFORM 1X8 LF (GAUZE/BANDAGES/DRESSINGS) ×3 IMPLANT
GLOVE BIO SURGEON STRL SZ 6.5 (GLOVE) ×1 IMPLANT
GLOVE BIO SURGEONS STRL SZ 6.5 (GLOVE) ×1
GLOVE BIOGEL M STRL SZ7.5 (GLOVE) IMPLANT
GLOVE BIOGEL PI IND STRL 7.0 (GLOVE) IMPLANT
GLOVE BIOGEL PI INDICATOR 7.0 (GLOVE) ×2
GLOVE SS BIOGEL STRL SZ 8 (GLOVE) ×1 IMPLANT
GLOVE SUPERSENSE BIOGEL SZ 8 (GLOVE) ×4
GOWN STRL REUS W/ TWL LRG LVL3 (GOWN DISPOSABLE) ×1 IMPLANT
GOWN STRL REUS W/ TWL XL LVL3 (GOWN DISPOSABLE) ×1 IMPLANT
GOWN STRL REUS W/TWL LRG LVL3 (GOWN DISPOSABLE) ×3
GOWN STRL REUS W/TWL XL LVL3 (GOWN DISPOSABLE) ×3
LOOP VESSEL MAXI BLUE (MISCELLANEOUS) IMPLANT
NDL HYPO 25X1 1.5 SAFETY (NEEDLE) ×2 IMPLANT
NDL SAFETY ECLIPSE 18X1.5 (NEEDLE) ×1 IMPLANT
NEEDLE HYPO 18GX1.5 SHARP (NEEDLE) ×3
NEEDLE HYPO 22GX1.5 SAFETY (NEEDLE) IMPLANT
NEEDLE HYPO 25X1 1.5 SAFETY (NEEDLE) ×6 IMPLANT
NS IRRIG 1000ML POUR BTL (IV SOLUTION) ×3 IMPLANT
PACK BASIN DAY SURGERY FS (CUSTOM PROCEDURE TRAY) ×3 IMPLANT
PAD ALCOHOL SWAB (MISCELLANEOUS) ×24 IMPLANT
PAD CAST 3X4 CTTN HI CHSV (CAST SUPPLIES) ×2 IMPLANT
PADDING CAST ABS 4INX4YD NS (CAST SUPPLIES) ×2
PADDING CAST ABS COTTON 4X4 ST (CAST SUPPLIES) ×1 IMPLANT
PADDING CAST COTTON 3X4 STRL (CAST SUPPLIES) ×6
SHEET MEDIUM DRAPE 40X70 STRL (DRAPES) ×3 IMPLANT
STOCKINETTE 4X48 STRL (DRAPES) ×3 IMPLANT
SUT PROLENE 4 0 PS 2 18 (SUTURE) ×3 IMPLANT
SYR BULB 3OZ (MISCELLANEOUS) ×3 IMPLANT
SYR CONTROL 10ML LL (SYRINGE) ×6 IMPLANT
TOWEL OR 17X24 6PK STRL BLUE (TOWEL DISPOSABLE) ×3 IMPLANT
TOWEL OR NON WOVEN STRL DISP B (DISPOSABLE) ×3 IMPLANT
TRAY DSU PREP LF (CUSTOM PROCEDURE TRAY) ×3 IMPLANT
UNDERPAD 30X30 (UNDERPADS AND DIAPERS) ×3 IMPLANT

## 2015-11-27 NOTE — Op Note (Signed)
see dictation [id number] Mosie Epstein.D.

## 2015-11-27 NOTE — Anesthesia Procedure Notes (Signed)
Procedure Name: MAC Date/Time: 11/27/2015 8:40 AM Performed by: Terrance Mass Pre-anesthesia Checklist: Patient identified, Emergency Drugs available, Suction available, Patient being monitored and Timeout performed Patient Re-evaluated:Patient Re-evaluated prior to inductionOxygen Delivery Method: Simple face mask

## 2015-11-27 NOTE — Discharge Instructions (Signed)
You did very well.  Please elevate her arm and move your fingers gently.  You should have some numbness in your hand for 24 hours due to the anesthetic  Please call for any problems  We recommend that you to take vitamin C 1000 mg a day to promote healing. We also recommend that if you require  pain medicine that you take a stool softener to prevent constipation as most pain medicines will have constipation side effects. We recommend either Peri-Colace or Senokot and recommend that you also consider adding MiraLAX as well to prevent the constipation affects from pain medicine if you are required to use them. These medicines are over the counter and may be purchased at a local pharmacy. A cup of yogurt and a probiotic can also be helpful during the recovery process as the medicines can disrupt your intestinal environment. Keep bandage clean and dry.  Call for any problems.  No smoking.  Criteria for driving a car: you should be off your pain medicine for 7-8 hours, able to drive one handed(confident), thinking clearly and feeling able in your judgement to drive. Continue elevation as it will decrease swelling.  If instructed by MD move your fingers within the confines of the bandage/splint.  Use ice if instructed by your MD. Call immediately for any sudden loss of feeling in your hand/arm or change in functional abilities of the extremity.    Post Anesthesia Home Care Instructions  Activity: Get plenty of rest for the remainder of the day. A responsible adult should stay with you for 24 hours following the procedure.  For the next 24 hours, DO NOT: -Drive a car -Paediatric nurse -Drink alcoholic beverages -Take any medication unless instructed by your physician -Make any legal decisions or sign important papers.  Meals: Start with liquid foods such as gelatin or soup. Progress to regular foods as tolerated. Avoid greasy, spicy, heavy foods. If nausea and/or vomiting occur, drink only clear  liquids until the nausea and/or vomiting subsides. Call your physician if vomiting continues.  Special Instructions/Symptoms: Your throat may feel dry or sore from the anesthesia or the breathing tube placed in your throat during surgery. If this causes discomfort, gargle with warm salt water. The discomfort should disappear within 24 hours.  If you had a scopolamine patch placed behind your ear for the management of post- operative nausea and/or vomiting:  1. The medication in the patch is effective for 72 hours, after which it should be removed.  Wrap patch in a tissue and discard in the trash. Wash hands thoroughly with soap and water. 2. You may remove the patch earlier than 72 hours if you experience unpleasant side effects which may include dry mouth, dizziness or visual disturbances. 3. Avoid touching the patch. Wash your hands with soap and water after contact with the patch.

## 2015-11-27 NOTE — Op Note (Signed)
[name], Colleen Carter               ACCOUNT NO.:  [id number]  MEDICAL RECORD NO.:  [id number]  LOCATION:                                 FACILITY:  PHYSICIAN:  Satira Anis. Reianna Batdorf, M.D.DATE OF BIRTH:  1961/02/27  DATE OF PROCEDURE: DATE OF DISCHARGE:                              OPERATIVE REPORT   PREOPERATIVE DIAGNOSIS:  Left carpal tunnel syndrome.  POSTOPERATIVE DIAGNOSIS:  Left carpal tunnel syndrome.  PROCEDURES: 1. Left median nerve/peripheral nerve block at the wrist-forearm level     for anesthetic purposes for carpal tunnel release. 2. Left limited open carpal tunnel release.  SURGEON:  Satira Anis. Amedeo Plenty, M.D.  ASSISTANT:  None.  COMPLICATIONS:  None.  ANESTHESIA:  Peripheral nerve block with IV sedation/field block.  This was performed by myself.  OPERATION IN DETAIL:  The patient was seen by myself and Anesthesia, taken to the operative theater and underwent smooth induction of peripheral nerve/median nerve block at the wrist-forearm level.  She tolerated this well.  She was prepped and draped in usual sterile fashion and underwent preoperative antibiotic administration.  Following this, a time-out was observed.  Arm was elevated.  Tourniquet was insufflated to 250 mmHg, and a left palmar incision was made. Dissection was carried down to 1 cm to 1.5 cm incision.  Retractor placed.  Distal edge of the transverse carpal ligament was identified. She had a very thickened palmaris brevis muscle.  This muscle was very carefully released as was the distal edge of the transverse carpal ligament until one could verify fat pad aggression and complete release distally.  Once this was noted, we then dissected it in a distal to proximal direction until adequate room was available for canal, prepared toward device 1, 2, and 3 which were placed just under the proximal leading leaflet of the transverse carpal ligament.  Following this, we then performed very careful and cautious  approach to the release with placement of the security clip, followed by watching the obturator disengaged nicely.  She was once again awake, alert, and oriented during all points and passes of the canal prepared toward device and security clip placement.  This security knife was placed and the security clip, effectively releasing the proximal leaflet of transverse carpal ligament.  Once this was complete, we then irrigated copiously, secured hemostasis with bipolar electrocautery.  The patient was awake, alert, and oriented during all points and passes, had no complicating features and went excellent.  She was sutured after hemostasis was secured with Prolene, and a sterile dressing was applied.  She will be monitored in the recovery room and discharged to home following a period of observatory care oxycodone written for pain.  These notes have been discussed, and all questions have been encouraged and answered.     Satira Anis. Amedeo Plenty, M.D.     [facility]  D:  11/27/2015  T:  11/27/2015  Job:  [id number]

## 2015-11-27 NOTE — Transfer of Care (Signed)
Immediate Anesthesia Transfer of Care Note  Patient: Colleen Carter  Procedure(s) Performed: Procedure(s): LEFT LIMITED OPEN CARPAL TUNNEL RELEASE (Left)  Patient Location: PACU  Anesthesia Type:MAC  Level of Consciousness: awake, alert  and oriented  Airway & Oxygen Therapy: Patient Spontanous Breathing  Post-op Assessment: Report given to RN and Post -op Vital signs reviewed and stable  Post vital signs: Reviewed and stable  Last Vitals:  Filed Vitals:   11/27/15 0714  BP: 131/76  Pulse: 83  Temp: 36.8 C  Resp: 18    Complications: No apparent anesthesia complications

## 2015-11-27 NOTE — Anesthesia Postprocedure Evaluation (Signed)
Anesthesia Post Note  Patient: Colleen Carter  Procedure(s) Performed: Procedure(s) (LRB): LEFT LIMITED OPEN CARPAL TUNNEL RELEASE (Left)  Patient location during evaluation: PACU Anesthesia Type: MAC Level of consciousness: awake and alert Pain management: pain level controlled Vital Signs Assessment: post-procedure vital signs reviewed and stable Respiratory status: spontaneous breathing Cardiovascular status: stable Anesthetic complications: no    Last Vitals:  Filed Vitals:   11/27/15 1000 11/27/15 1016  BP: 117/69 124/61  Pulse: 67 68  Temp:  36.9 C  Resp: 15 16    Last Pain:  Filed Vitals:   11/27/15 1017  PainSc: 0-No pain                 Nolon Nations

## 2015-11-27 NOTE — Anesthesia Preprocedure Evaluation (Signed)
Anesthesia Evaluation  Patient identified by MRN, date of birth, ID band Patient awake    Reviewed: Allergy & Precautions, NPO status , Patient's Chart, lab work & pertinent test results  Airway Mallampati: II  TM Distance: >3 FB Neck ROM: Full    Dental no notable dental hx.    Pulmonary neg pulmonary ROS, former smoker,    Pulmonary exam normal breath sounds clear to auscultation       Cardiovascular + angina Normal cardiovascular exam Rhythm:Regular Rate:Normal     Neuro/Psych negative neurological ROS  negative psych ROS   GI/Hepatic Neg liver ROS, GERD  ,  Endo/Other  Hypothyroidism   Renal/GU negative Renal ROS     Musculoskeletal negative musculoskeletal ROS (+)   Abdominal (+) + obese,   Peds  Hematology negative hematology ROS (+)   Anesthesia Other Findings   Reproductive/Obstetrics negative OB ROS                             Anesthesia Physical Anesthesia Plan  ASA: II  Anesthesia Plan: MAC   Post-op Pain Management:    Induction: Intravenous  Airway Management Planned:   Additional Equipment:   Intra-op Plan:   Post-operative Plan:   Informed Consent: I have reviewed the patients History and Physical, chart, labs and discussed the procedure including the risks, benefits and alternatives for the proposed anesthesia with the patient or authorized representative who has indicated his/her understanding and acceptance.   Dental advisory given  Plan Discussed with: CRNA  Anesthesia Plan Comments:         Anesthesia Quick Evaluation

## 2015-11-27 NOTE — H&P (Signed)
Colleen Carter is an 55 y.o. female.   Chief Complaint: Left carpal tunnel syndrome HPI: Patient presents for left limited open carpal tunnel release Patient presents for evaluation and treatment of the of their upper extremity predicament. The patient denies neck, back, chest or  abdominal pain. The patient notes that they have no lower extremity problems. The patients primary complaint is noted. We are planning surgical care pathway for the upper extremity. Past Medical History  Diagnosis Date  . Hypothyroidism   . GERD (gastroesophageal reflux disease)   . Anginal pain ([place])     turned out to be muscular  . Carpal tunnel syndrome     Past Surgical History  Procedure Laterality Date  . Tubal ligation    . Breast surgery      breast reduction  . Total thyroidectomy    . Carpal tunnel release Right     Family History  Problem Relation Age of Onset  . Diabetes Sister   . Stroke Brother    Social History:  reports that she has quit smoking. She does not have any smokeless tobacco history on file. She reports that she drinks alcohol. She reports that she does not use illicit drugs.  Allergies: No Known Allergies  Medications Prior to Admission  Medication Sig Dispense Refill  . calcium carbonate 200 MG capsule Take 250 mg by mouth 2 (two) times daily with a meal. Reported on 11/24/2015    . cholecalciferol (VITAMIN D) 1000 UNITS tablet Take 1,000 Units by mouth daily. Reported on 11/24/2015    . HYDROcodone-acetaminophen (NORCO/VICODIN) 5-325 MG tablet Take 1 tablet by mouth every 6 (six) hours as needed for moderate pain. 15 tablet 0  . Levothyroxine Sodium (SYNTHROID PO) Take 1 mg by mouth.    . Multiple Vitamins-Minerals (MULTIVITAMIN WITH MINERALS) tablet Take 1 tablet by mouth daily. Reported on 11/24/2015    . cyclobenzaprine (FLEXERIL) 5 MG tablet 1 pill by mouth up to every 8 hours as needed. Start with one pill by mouth each bedtime as needed due to sedation 15 tablet 0  .  nitroGLYCERIN (NITROSTAT) 0.4 MG SL tablet Place 1 tablet (0.4 mg total) under the tongue every 5 (five) minutes as needed for chest pain. (Patient not taking: Reported on 11/24/2015) 50 tablet 3    No results found for this or any previous visit (from the past 48 hour(s)). No results found.  ROS  Blood pressure 131/76, pulse 83, temperature 98.2 F (36.8 C), temperature source Oral, resp. rate 18, height 5' 7.5" (1.715 m), weight 97.07 kg (214 lb), SpO2 98 %. Physical Exam  Left carpal tunnel syndrome with positive Phalen's Tinel's and median nerve compression test. No evidence of infection or dystrophy.  The patient is alert and oriented in no acute distress. The patient complains of pain in the affected upper extremity.  The patient is noted to have a normal HEENT exam. Lung fields show equal chest expansion and no shortness of breath. Abdomen exam is nontender without distention. Lower extremity examination does not show any fracture dislocation or blood clot symptoms. Pelvis is stable and the neck and back are stable and nontender. Assessment/Plan Plan for left limited open carpal tunnel release We are planning surgery for your upper extremity. The risk and benefits of surgery to include risk of bleeding, infection, anesthesia,  damage to normal structures and failure of the surgery to accomplish its intended goals of relieving symptoms and restoring function have been discussed in detail. With this in  mind we plan to proceed. I have specifically discussed with the patient the pre-and postoperative regime and the dos and don'ts and risk and benefits in great detail. Risk and benefits of surgery also include risk of dystrophy(CRPS), chronic nerve pain, failure of the healing process to go onto completion and other inherent risks of surgery The relavent the pathophysiology of the disease/injury process, as well as the alternatives for treatment and postoperative course of action has been  discussed in great detail with the patient who desires to proceed.  We will do everything in our power to help you (the patient) restore function to the upper extremity. It is a pleasure to see this patient today.  Paulene Floor, MD 11/27/2015, 8:43 AM

## 2015-11-30 ENCOUNTER — Encounter (HOSPITAL_BASED_OUTPATIENT_CLINIC_OR_DEPARTMENT_OTHER): Payer: Self-pay | Admitting: Orthopedic Surgery

## 2015-12-11 ENCOUNTER — Encounter (HOSPITAL_BASED_OUTPATIENT_CLINIC_OR_DEPARTMENT_OTHER): Payer: Self-pay | Admitting: Orthopedic Surgery

## 2016-01-14 ENCOUNTER — Other Ambulatory Visit (HOSPITAL_COMMUNITY): Payer: Self-pay | Admitting: Sports Medicine

## 2016-01-14 ENCOUNTER — Ambulatory Visit (HOSPITAL_COMMUNITY)
Admission: RE | Admit: 2016-01-14 | Discharge: 2016-01-14 | Disposition: A | Discharge status: Home / Self Care | Payer: 59 | Source: Ambulatory Visit | Attending: Cardiovascular Disease | Admitting: Cardiovascular Disease

## 2016-01-14 DIAGNOSIS — K219 Gastro-esophageal reflux disease without esophagitis: Secondary | ICD-10-CM | POA: Insufficient documentation

## 2016-01-14 DIAGNOSIS — M7989 Other specified soft tissue disorders: Secondary | ICD-10-CM | POA: Diagnosis not present

## 2016-01-19 ENCOUNTER — Telehealth: Payer: Self-pay | Admitting: Cardiology

## 2016-01-19 NOTE — Telephone Encounter (Signed)
New message     Pt had a Vascular US done on May 11 th and read by Dr. Julienne Kass orthopedic was wanting to have the report fax to them at [phone]

## 2016-08-04 ENCOUNTER — Emergency Department
Admission: EM | Admit: 2016-08-04 | Discharge: 2016-08-04 | Disposition: A | Discharge status: Home / Self Care | Payer: 59 | Source: Home / Self Care | Attending: Family Medicine | Admitting: Family Medicine

## 2016-08-04 ENCOUNTER — Encounter: Payer: Self-pay | Admitting: Emergency Medicine

## 2016-08-04 DIAGNOSIS — J069 Acute upper respiratory infection, unspecified: Secondary | ICD-10-CM

## 2016-08-04 DIAGNOSIS — B9789 Other viral agents as the cause of diseases classified elsewhere: Secondary | ICD-10-CM

## 2016-08-04 DIAGNOSIS — T783XXA Angioneurotic edema, initial encounter: Secondary | ICD-10-CM

## 2016-08-04 MED ORDER — AZITHROMYCIN 250 MG PO TABS: ORAL_TABLET | ORAL | 0 refills | Status: DC

## 2016-08-04 MED ORDER — PREDNISONE 20 MG PO TABS: 20.0000 mg | ORAL_TABLET | Freq: Two times a day (BID) | ORAL | 0 refills | Status: DC

## 2016-08-04 MED ORDER — BENZONATATE 200 MG PO CAPS: 200.0000 mg | ORAL_CAPSULE | Freq: Every day | ORAL | 0 refills | Status: DC

## 2016-08-04 NOTE — ED Triage Notes (Signed)
Rt side jaw and lips swollen upon waking this morning. She has had a cold for 5 days taking OTC meds, denies pain, took a benadryl this morning with no change.

## 2016-08-04 NOTE — ED Provider Notes (Signed)
[name]    CSN: [id number] Arrival date & time: 08/04/16  1354     History   Chief Complaint Chief Complaint  Patient presents with  . Facial Swelling    HPI Colleen Carter is a 55 y.o. female.   Patient complains of four day history of typical cold-like symptoms developing over several days, including mild sore throat, sinus congestion, headache, fatigue, and cough.  This morning she awoke with painless swelling in her right lips and cheek.  No swelling of tongue or throat, and no difficulty swallowing.  No wheezing or shortness of breath.  She had no improvement after taking Benadryl.   The history is provided by the patient.    Past Medical History:  Diagnosis Date  . Anginal pain ([place])    turned out to be muscular  . Carpal tunnel syndrome   . GERD (gastroesophageal reflux disease)   . Hypothyroidism     Patient Active Problem List   Diagnosis Date Noted  . Hypothyroidism (acquired) 03/07/2014  . Skin ulcer ([place]) 04/03/2012    Past Surgical History:  Procedure Laterality Date  . BREAST SURGERY     breast reduction  . CARPAL TUNNEL RELEASE Right   . CARPAL TUNNEL RELEASE Left 11/27/2015   Procedure: LEFT LIMITED OPEN CARPAL TUNNEL RELEASE;  Surgeon: Roseanne Kaufman, MD;  Location: [facility];  Service: Orthopedics;  Laterality: Left;  . TOTAL THYROIDECTOMY    . TUBAL LIGATION      OB History    No data available       Home Medications    Prior to Admission medications   Medication Sig Start Date End Date Taking? Authorizing Provider  azithromycin (ZITHROMAX Z-PAK) 250 MG tablet Take 2 tabs today; then begin one tab once daily for 4 more days. (Rx void after 08/13/16) 08/04/16   Kandra Nicolas, MD  benzonatate (TESSALON) 200 MG capsule Take 1 capsule (200 mg total) by mouth at bedtime. Take as needed for cough 08/04/16   Kandra Nicolas, MD  calcium carbonate 200 MG capsule Take 250 mg by mouth 2 (two) times daily with  a meal. Reported on 11/24/2015    Historical Provider, MD  cholecalciferol (VITAMIN D) 1000 UNITS tablet Take 1,000 Units by mouth daily. Reported on 11/24/2015    Historical Provider, MD  cyclobenzaprine (FLEXERIL) 5 MG tablet 1 pill by mouth up to every 8 hours as needed. Start with one pill by mouth each bedtime as needed due to sedation 11/24/15   Wendie Agreste, MD  HYDROcodone-acetaminophen (NORCO/VICODIN) 5-325 MG tablet Take 1 tablet by mouth every 6 (six) hours as needed for moderate pain. 11/24/15   Wendie Agreste, MD  Levothyroxine Sodium (SYNTHROID PO) Take 1 mg by mouth.    Historical Provider, MD  Multiple Vitamins-Minerals (MULTIVITAMIN WITH MINERALS) tablet Take 1 tablet by mouth daily. Reported on 11/24/2015    Historical Provider, MD  nitroGLYCERIN (NITROSTAT) 0.4 MG SL tablet Place 1 tablet (0.4 mg total) under the tongue every 5 (five) minutes as needed for chest pain. Patient not taking: Reported on 11/24/2015 10/14/15   Elby Beck, FNP  oxyCODONE (OXY IR/ROXICODONE) 5 MG immediate release tablet Take 2 tablets (10 mg total) by mouth every 4 (four) hours as needed for moderate pain or severe pain. 11/27/15   Roseanne Kaufman, MD  predniSONE (DELTASONE) 20 MG tablet Take 1 tablet (20 mg total) by mouth 2 (two) times daily. Take with food. 08/04/16  Kandra Nicolas, MD    Family History Family History  Problem Relation Age of Onset  . Diabetes Sister   . Stroke Brother     Social History Social History  Substance Use Topics  . Smoking status: Former Research scientist (life sciences)  . Smokeless tobacco: Never Used  . Alcohol use Yes     Comment: social     Allergies   Patient has no known allergies.   Review of Systems Review of Systems + sore throat + cough No pleuritic pain No wheezing + nasal congestion + post-nasal drainage No sinus pain/pressure No itchy/red eyes No earache No hemoptysis No SOB No fever, + chills No nausea No vomiting No abdominal pain No diarrhea No  urinary symptoms No skin rash + fatigue No myalgias No headache Used OTC meds without relief   Physical Exam Triage Vital Signs ED Triage Vitals  Enc Vitals Group     BP 08/04/16 1431 118/76     Pulse Rate 08/04/16 1431 77     Resp --      Temp 08/04/16 1431 98.2 F (36.8 C)     Temp Source 08/04/16 1431 Oral     SpO2 08/04/16 1431 98 %     Weight 08/04/16 1431 200 lb (90.7 kg)     Height 08/04/16 1431 5\' 7"  (1.702 m)     Head Circumference --      Peak Flow --      Pain Score 08/04/16 1434 0     Pain Loc --      Pain Edu? --      Excl. in [place]? --    No data found.   Updated Vital Signs BP 118/76 (BP Location: Left Arm)   Pulse 77   Temp 98.2 F (36.8 C) (Oral)   Ht 5\' 7"  (1.702 m)   Wt 200 lb (90.7 kg)   SpO2 98%   BMI 31.32 kg/m   Visual Acuity Right Eye Distance:   Left Eye Distance:   Bilateral Distance:    Right Eye Near:   Left Eye Near:    Bilateral Near:     Physical Exam  Constitutional: She appears well-developed and well-nourished. No distress.  HENT:  Head:    Right Ear: Tympanic membrane, external ear and ear canal normal.  Left Ear: Tympanic membrane, external ear and ear canal normal.  Nose: Nose normal.  Mouth/Throat: Oropharynx is clear and moist.  There is mild non-tender swelling of the right cheek and lower lip.  No erythema or warmth.  Eyes: Conjunctivae and EOM are normal. Pupils are equal, round, and reactive to light.  Neck: Neck supple.  Slightly enlarged lateral cervical nodes.  Cardiovascular: Normal heart sounds.   Pulmonary/Chest: Breath sounds normal.  Abdominal: There is no tenderness.  Musculoskeletal: She exhibits no edema.  Neurological: She is alert.  Skin: Skin is warm and dry. No rash noted. No erythema.  Nursing note and vitals reviewed.    UC Treatments / Results  Labs (all labs ordered are listed, but only abnormal results are displayed) Labs Reviewed - No data to display  EKG  EKG  Interpretation None       Radiology No results found.  Procedures Procedures (including critical care time)  Medications Ordered in UC Medications - No data to display   Initial Impression / Assessment and Plan / UC Course  I have reviewed the triage vital signs and the nursing notes.  Pertinent labs & imaging results that were available during  my care of the patient were reviewed by me and considered in my medical decision making (see chart for details).  Clinical Course   Begin prednisone burst. Prescription written for Benzonatate (Tessalon) to take at bedtime for night-time cough.  There is no evidence of bacterial infection today.   Take plain guaifenesin (1200mg  extended release tabs such as Mucinex) twice daily, with plenty of water, for cough and congestion.  May add Pseudoephedrine (30mg , one or two every 4 to 6 hours) for sinus congestion.  Get adequate rest.   Try warm salt water gargles for sore throat.  Stop all antihistamines for now, and other non-prescription cough/cold preparations. Begin Azithromycin if not improving about one week or if persistent fever develops (Given a prescription to hold, with an expiration date)  Follow-up with family doctor if not improving about10 days.      Final Clinical Impressions(s) / UC Diagnoses   Final diagnoses:  Angioedema of lips, initial encounter  Viral URI with cough    New Prescriptions New Prescriptions   AZITHROMYCIN (ZITHROMAX Z-PAK) 250 MG TABLET    Take 2 tabs today; then begin one tab once daily for 4 more days. (Rx void after 08/13/16)   BENZONATATE (TESSALON) 200 MG CAPSULE    Take 1 capsule (200 mg total) by mouth at bedtime. Take as needed for cough   PREDNISONE (DELTASONE) 20 MG TABLET    Take 1 tablet (20 mg total) by mouth 2 (two) times daily. Take with food.     Kandra Nicolas, MD 08/11/16 1320

## 2016-08-04 NOTE — Discharge Instructions (Signed)
Take plain guaifenesin (1200mg  extended release tabs such as Mucinex) twice daily, with plenty of water, for cough and congestion.  May add Pseudoephedrine (30mg , one or two every 4 to 6 hours) for sinus congestion.  Get adequate rest.   Try warm salt water gargles for sore throat.  Stop all antihistamines for now, and other non-prescription cough/cold preparations. Begin Azithromycin if not improving about one week or if persistent fever develops (Given a prescription to hold, with an expiration date)  Follow-up with family doctor if not improving about10 days.

## 2016-11-03 ENCOUNTER — Ambulatory Visit: Payer: 59 | Admitting: Podiatry

## 2016-11-18 ENCOUNTER — Ambulatory Visit (INDEPENDENT_AMBULATORY_CARE_PROVIDER_SITE_OTHER): Payer: 59 | Admitting: Podiatry

## 2016-11-18 ENCOUNTER — Ambulatory Visit (INDEPENDENT_AMBULATORY_CARE_PROVIDER_SITE_OTHER): Payer: 59

## 2016-11-18 ENCOUNTER — Encounter: Payer: Self-pay | Admitting: Podiatry

## 2016-11-18 VITALS — Resp 16 | Ht 67.5 in | Wt 205.0 lb

## 2016-11-18 DIAGNOSIS — M722 Plantar fascial fibromatosis: Secondary | ICD-10-CM | POA: Diagnosis not present

## 2016-11-18 MED ORDER — DICLOFENAC SODIUM 75 MG PO TBEC: 75.0000 mg | DELAYED_RELEASE_TABLET | Freq: Two times a day (BID) | ORAL | 2 refills | Status: DC

## 2016-11-18 MED ORDER — TRIAMCINOLONE ACETONIDE 10 MG/ML IJ SUSP
10.0000 mg | Freq: Once | INTRAMUSCULAR | Status: AC
  Administered 2016-11-18: 10 mg

## 2016-11-18 NOTE — Progress Notes (Signed)
   Subjective:    Patient ID: Colleen Carter, female    DOB: [birth year], 56 y.o.   MRN: [id number]  HPI  Chief Complaint  Patient presents with  . Foot Pain    BL; Bottom of heel and plantar forefoot x 2 weeks.   . Callouses    Right; Plantar forefoot. Pt stated that she used ahesive pads and it helped, wants the area checked"   . Foot Orthotics    Pt is requesting orthotics.        Review of Systems     Objective:   Physical Exam        Assessment & Plan:

## 2016-11-18 NOTE — Patient Instructions (Signed)

## 2016-11-20 NOTE — Progress Notes (Signed)
Subjective:     Patient ID: Colleen Carter, female   DOB: July 26, 1961, 56 y.o.   MRN: [id number]  HPI patient presents stating she's had significant pain in her right plantar heel and her forefoot bilateral right over left has been sore and she tries to use adhesive     Review of Systems  All other systems reviewed and are negative.      Objective:   Physical Exam  Constitutional: She is oriented to person, place, and time.  Cardiovascular: Intact distal pulses.   Musculoskeletal: Normal range of motion.  Neurological: She is oriented to person, place, and time.  Skin: Skin is warm.  Nursing note and vitals reviewed.  neurovascular status intact muscle strength adequate range of motion within normal limits with patient noted to have discomfort in the plantar right heel and also mild to moderate discomfort in the forefoot right over left nondescript in nature. Patient's noted to have good digital perfusion well oriented 3     Assessment:     Inflammatory fasciitis of the right heel with forefoot capsulitis of a moderate intensity    Plan:     H&P and x-rays of both feet reviewed. Today injected the right plantar fashion 3 mg Kenalog 5 mill grams Xylocaine and applied fascial brace and instructed on physical therapy supportive shoe gear usage. Reappoint to recheck and will require orthotics long-term and we'll also look at the capsule of the lesser MPJs and see response after the heel is treated  X-ray report indicates that there is spur formation with no indication stress fracture arthritis with moderate depression of the arch

## 2016-12-02 ENCOUNTER — Encounter: Payer: Self-pay | Admitting: Podiatry

## 2016-12-02 ENCOUNTER — Ambulatory Visit (INDEPENDENT_AMBULATORY_CARE_PROVIDER_SITE_OTHER): Payer: 59 | Admitting: Podiatry

## 2016-12-02 DIAGNOSIS — M722 Plantar fascial fibromatosis: Secondary | ICD-10-CM

## 2016-12-02 NOTE — Progress Notes (Signed)
Subjective:     Patient ID: Colleen Carter, female   DOB: 12/15/1960, 56 y.o.   MRN: [id number]  HPI patient presents stating that she no she needs new orthotics and her heel is feeling somewhat better than previously   Review of Systems     Objective:   Physical Exam Neurovascular status intact with mild to moderate discomfort plantar heel that's been repetitive with old orthotics which have worn out which were very beneficial for her    Assessment:     Plantar fasciitis with moderate improvement and chronic foot structural issues    Plan:     H&P condition reviewed and today I recommended long-term orthotics physical therapy and stretching. Patient is scanned for customized orthotics and we will make a Berkley type device with deep heel seat

## 2016-12-03 ENCOUNTER — Encounter: Payer: Self-pay | Admitting: Emergency Medicine

## 2016-12-03 ENCOUNTER — Emergency Department
Admission: EM | Admit: 2016-12-03 | Discharge: 2016-12-03 | Disposition: A | Discharge status: Home / Self Care | Payer: 59 | Source: Home / Self Care | Attending: Family Medicine | Admitting: Family Medicine

## 2016-12-03 ENCOUNTER — Emergency Department (INDEPENDENT_AMBULATORY_CARE_PROVIDER_SITE_OTHER): Payer: 59

## 2016-12-03 DIAGNOSIS — M79622 Pain in left upper arm: Secondary | ICD-10-CM | POA: Diagnosis not present

## 2016-12-03 MED ORDER — PREDNISONE 20 MG PO TABS: ORAL_TABLET | ORAL | 0 refills | Status: DC

## 2016-12-03 MED ORDER — CYCLOBENZAPRINE HCL 10 MG PO TABS: 10.0000 mg | ORAL_TABLET | Freq: Every day | ORAL | 0 refills | Status: DC

## 2016-12-03 NOTE — ED Provider Notes (Signed)
[name]    CSN: [id number] Arrival date & time: 12/03/16  1255     History   Chief Complaint Chief Complaint  Patient presents with  . Shoulder Pain    HPI Colleen Carter is a 56 y.o. female.   Patient complains of 3 week history of pain in her left shoulder and left upper arm.  She recalls no injury.  The pain is worse at night, and not improved with ibuprofen.   The history is provided by the patient and the spouse.  Shoulder Pain  Location:  Shoulder and arm Shoulder location:  L shoulder Arm location:  L arm Injury: no   Pain details:    Quality:  Aching   Radiates to:  Does not radiate   Severity:  Mild   Onset quality:  Gradual   Duration:  3 weeks   Timing:  Constant   Progression:  Unchanged Handedness:  Right-handed Prior injury to area:  No Relieved by:  Nothing Worsened by:  Movement Ineffective treatments:  NSAIDs ("icy hot" pads) Associated symptoms: decreased range of motion   Associated symptoms: no back pain, no muscle weakness, no numbness, no stiffness, no swelling and no tingling     Past Medical History:  Diagnosis Date  . Anginal pain ([place])    turned out to be muscular  . Carpal tunnel syndrome   . GERD (gastroesophageal reflux disease)   . Hypothyroidism     Patient Active Problem List   Diagnosis Date Noted  . Hypothyroidism (acquired) 03/07/2014  . Skin ulcer ([place]) 04/03/2012    Past Surgical History:  Procedure Laterality Date  . BREAST SURGERY     breast reduction  . CARPAL TUNNEL RELEASE Right   . CARPAL TUNNEL RELEASE Left 11/27/2015   Procedure: LEFT LIMITED OPEN CARPAL TUNNEL RELEASE;  Surgeon: Roseanne Kaufman, MD;  Location: [facility];  Service: Orthopedics;  Laterality: Left;  . TOTAL THYROIDECTOMY    . TUBAL LIGATION      OB History    No data available       Home Medications    Prior to Admission medications   Medication Sig Start Date End Date Taking? Authorizing Provider   calcium carbonate 200 MG capsule Take 250 mg by mouth 2 (two) times daily with a meal. Reported on 11/24/2015    Historical Provider, MD  cholecalciferol (VITAMIN D) 1000 UNITS tablet Take 1,000 Units by mouth daily. Reported on 11/24/2015    Historical Provider, MD  cyclobenzaprine (FLEXERIL) 10 MG tablet Take 1 tablet (10 mg total) by mouth at bedtime. 12/03/16   Kandra Nicolas, MD  diclofenac (VOLTAREN) 75 MG EC tablet Take 1 tablet (75 mg total) by mouth 2 (two) times daily. 11/18/16   Wallene Huh, DPM  Levothyroxine Sodium (SYNTHROID PO) Take 1 mg by mouth.    Historical Provider, MD  Multiple Vitamins-Minerals (MULTIVITAMIN WITH MINERALS) tablet Take 1 tablet by mouth daily. Reported on 11/24/2015    Historical Provider, MD  predniSONE (DELTASONE) 20 MG tablet Take one tab by mouth twice daily for 5 days, then one daily. Take with food. 12/03/16   Kandra Nicolas, MD    Family History Family History  Problem Relation Age of Onset  . Diabetes Sister   . Stroke Brother     Social History Social History  Substance Use Topics  . Smoking status: Former Research scientist (life sciences)  . Smokeless tobacco: Never Used  . Alcohol use Yes  Comment: social     Allergies   Patient has no known allergies.   Review of Systems Review of Systems  Musculoskeletal: Negative for back pain and stiffness.  All other systems reviewed and are negative.    Physical Exam Triage Vital Signs ED Triage Vitals [12/03/16 1349]  Enc Vitals Group     BP (!) 148/82     Pulse Rate 66     Resp      Temp 97.7 F (36.5 C)     Temp Source Oral     SpO2 98 %     Weight 206 lb 8 oz (93.7 kg)     Height 5' 7.5" (1.715 m)     Head Circumference      Peak Flow      Pain Score 10     Pain Loc      Pain Edu?      Excl. in [place]?    No data found.   Updated Vital Signs BP (!) 148/82 (BP Location: Left Arm)   Pulse 66   Temp 97.7 F (36.5 C) (Oral)   Ht 5' 7.5" (1.715 m)   Wt 206 lb 8 oz (93.7 kg)   SpO2 98%    BMI 31.87 kg/m   Visual Acuity Right Eye Distance:   Left Eye Distance:   Bilateral Distance:    Right Eye Near:   Left Eye Near:    Bilateral Near:     Physical Exam  Constitutional: She appears well-developed and well-nourished. No distress.  HENT:  Head: Atraumatic.  Mouth/Throat: Oropharynx is clear and moist.  Eyes: Pupils are equal, round, and reactive to light.  Neck: Normal range of motion.  Cardiovascular: Normal heart sounds.   Pulmonary/Chest: Breath sounds normal.  Musculoskeletal: She exhibits no edema.       Arms: Tenderness left upper arm as noted on diagram.   No shoulder tenderness.  No tenderness over biceps tendon.  Left shoulder has full range of motion.  Apley's test negative (although patient performs slowly).  No left shoulder tenderness, including subacromial bursa.  Negative empty can test.  Negative Hawkins test.  Good external rotation and strength.   Neurological: She is alert.  Skin: Skin is warm and dry.  Nursing note and vitals reviewed.    UC Treatments / Results  Labs (all labs ordered are listed, but only abnormal results are displayed) Labs Reviewed - No data to display  EKG  EKG Interpretation None       Radiology Dg Humerus Left  Result Date: 12/03/2016 CLINICAL DATA:  Left upper arm pain for 3 weeks without a known injury. Initial encounter. EXAM: LEFT HUMERUS - 2+ VIEW COMPARISON:  None. FINDINGS: There is no evidence of fracture or other focal bone lesions. Soft tissues are unremarkable. IMPRESSION: Negative. Electronically Signed   By: Logan Bores M.D.   On: 12/03/2016 14:27    Procedures Procedures (including critical care time)  Medications Ordered in UC Medications - No data to display   Initial Impression / Assessment and Plan / UC Course  I have reviewed the triage vital signs and the nursing notes.  Pertinent labs & imaging results that were available during my care of the patient were reviewed by me and  considered in my medical decision making (see chart for details).    Pain/tenderness appears to be localized in the mid-portion of the upper arm between the short and long heads of the biceps muscle, ?etiology. Begin prednisone burst/taper; Flexeril  at bedtime. Apply ice pack for 15 to 20 minutes, 3 to 4 times daily  Continue until pain and swelling decrease.  Followup with Dr. Aundria Mems or Dr. Lynne Leader ([facility]).    Final Clinical Impressions(s) / UC Diagnoses   Final diagnoses:  Left upper arm pain    New Prescriptions New Prescriptions   CYCLOBENZAPRINE (FLEXERIL) 10 MG TABLET    Take 1 tablet (10 mg total) by mouth at bedtime.   PREDNISONE (DELTASONE) 20 MG TABLET    Take one tab by mouth twice daily for 5 days, then one daily. Take with food.     Kandra Nicolas, MD 12/03/16 2138

## 2016-12-03 NOTE — Discharge Instructions (Signed)
Apply ice pack for 15 to 20 minutes, 3 to 4 times daily  Continue until pain and swelling decrease.

## 2016-12-03 NOTE — ED Triage Notes (Signed)
Pt c/o left shoulder pain that radiates down arm, no injury, just a throbbing sensation.

## 2016-12-26 ENCOUNTER — Ambulatory Visit (INDEPENDENT_AMBULATORY_CARE_PROVIDER_SITE_OTHER): Payer: Self-pay | Admitting: Podiatry

## 2016-12-26 DIAGNOSIS — M722 Plantar fascial fibromatosis: Secondary | ICD-10-CM

## 2016-12-26 NOTE — Patient Instructions (Signed)

## 2016-12-26 NOTE — Progress Notes (Signed)
Patient presents for orthotic pick up.  Verbal and written break in and wear instructions given.  Patient will follow up in 4 weeks if symptoms worsen or fail to improve. 

## 2017-04-22 ENCOUNTER — Emergency Department (INDEPENDENT_AMBULATORY_CARE_PROVIDER_SITE_OTHER): Payer: 59

## 2017-04-22 ENCOUNTER — Encounter: Payer: Self-pay | Admitting: *Deleted

## 2017-04-22 ENCOUNTER — Emergency Department
Admission: EM | Admit: 2017-04-22 | Discharge: 2017-04-22 | Disposition: A | Discharge status: Home / Self Care | Payer: 59 | Source: Home / Self Care | Attending: Family Medicine | Admitting: Family Medicine

## 2017-04-22 DIAGNOSIS — M25512 Pain in left shoulder: Secondary | ICD-10-CM

## 2017-04-22 DIAGNOSIS — M7522 Bicipital tendinitis, left shoulder: Secondary | ICD-10-CM

## 2017-04-22 MED ORDER — IBUPROFEN 600 MG PO TABS
600.0000 mg | ORAL_TABLET | Freq: Once | ORAL | Status: AC
  Administered 2017-04-22: 600 mg via ORAL

## 2017-04-22 MED ORDER — HYDROCODONE-ACETAMINOPHEN 5-325 MG PO TABS: 1.0000 | ORAL_TABLET | Freq: Four times a day (QID) | ORAL | 0 refills | Status: DC | PRN

## 2017-04-22 MED ORDER — MELOXICAM 15 MG PO TABS: 15.0000 mg | ORAL_TABLET | Freq: Every day | ORAL | 0 refills | Status: DC

## 2017-04-22 NOTE — ED Provider Notes (Signed)
[name]    CSN: [id number] Arrival date & time: 04/22/17  1204     History   Chief Complaint Chief Complaint  Patient presents with  . Shoulder Pain    HPI Colleen Carter is a 56 y.o. female.   Patient complains of pain and limited ability to move her left shoulder for one week.  She recalls no injury.  Her pain is worse at night and awakens her.   The history is provided by the patient.  Shoulder Pain  Location:  Shoulder Shoulder location:  L shoulder Injury: no   Pain details:    Quality:  Aching   Radiates to:  L upper arm   Severity:  Moderate   Onset quality:  Gradual   Duration:  1 week   Timing:  Constant   Progression:  Worsening Handedness:  Right-handed Dislocation: no   Foreign body present:  No foreign bodies Relieved by:  Nothing Worsened by:  Movement Ineffective treatments:  NSAIDs Associated symptoms: decreased range of motion, muscle weakness and stiffness   Associated symptoms: no fatigue, no fever, no neck pain and no numbness     Past Medical History:  Diagnosis Date  . Anginal pain ([place])    turned out to be muscular  . Carpal tunnel syndrome   . GERD (gastroesophageal reflux disease)   . Hypothyroidism     Patient Active Problem List   Diagnosis Date Noted  . Hypothyroidism (acquired) 03/07/2014  . Skin ulcer ([place]) 04/03/2012    Past Surgical History:  Procedure Laterality Date  . BREAST SURGERY     breast reduction  . CARPAL TUNNEL RELEASE Right   . CARPAL TUNNEL RELEASE Left 11/27/2015   Procedure: LEFT LIMITED OPEN CARPAL TUNNEL RELEASE;  Surgeon: Roseanne Kaufman, MD;  Location: [facility];  Service: Orthopedics;  Laterality: Left;  . TOTAL THYROIDECTOMY    . TUBAL LIGATION      OB History    No data available       Home Medications    Prior to Admission medications   Medication Sig Start Date End Date Taking? Authorizing Provider  calcium carbonate 200 MG capsule Take 250 mg by  mouth 2 (two) times daily with a meal. Reported on 11/24/2015    [provider]  cholecalciferol (VITAMIN D) 1000 UNITS tablet Take 1,000 Units by mouth daily. Reported on 11/24/2015    [provider]  diclofenac (VOLTAREN) 75 MG EC tablet Take 1 tablet (75 mg total) by mouth 2 (two) times daily. 11/18/16   Wallene Huh, DPM  HYDROcodone-acetaminophen (NORCO/VICODIN) 5-325 MG tablet Take 1 tablet by mouth every 6 (six) hours as needed for moderate pain. 04/22/17 05/05/17  Kandra Nicolas, MD  Levothyroxine Sodium (SYNTHROID PO) Take 1 mg by mouth.    [provider]  meloxicam (MOBIC) 15 MG tablet Take 1 tablet (15 mg total) by mouth daily. Take with food each morning 04/22/17   Kandra Nicolas, MD  Multiple Vitamins-Minerals (MULTIVITAMIN WITH MINERALS) tablet Take 1 tablet by mouth daily. Reported on 11/24/2015    [provider]    Family History Family History  Problem Relation Age of Onset  . Diabetes Sister   . Stroke Brother     Social History Social History  Substance Use Topics  . Smoking status: Former Research scientist (life sciences)  . Smokeless tobacco: Never Used  . Alcohol use Yes     Comment: social     Allergies  Patient has no known allergies.   Review of Systems Review of Systems  Constitutional: Negative for fatigue and fever.  Musculoskeletal: Positive for stiffness. Negative for neck pain.  All other systems reviewed and are negative.    Physical Exam Triage Vital Signs ED Triage Vitals  Enc Vitals Group     BP 04/22/17 1220 132/80     Pulse Rate 04/22/17 1220 86     Resp --      Temp --      Temp src --      SpO2 04/22/17 1220 97 %     Weight 04/22/17 1221 214 lb (97.1 kg)     Height --      Head Circumference --      Peak Flow --      Pain Score 04/22/17 1221 8     Pain Loc --      Pain Edu? --      Excl. in [place]? --    No data found.   Updated Vital Signs BP 132/80 (BP Location: Right Arm)   Pulse 86   Wt 214 lb (97.1  kg)   SpO2 97%   BMI 33.02 kg/m   Visual Acuity Right Eye Distance:   Left Eye Distance:   Bilateral Distance:    Right Eye Near:   Left Eye Near:    Bilateral Near:     Physical Exam  Constitutional: She appears well-developed and well-nourished. No distress.  HENT:  Head: Normocephalic.  Right Ear: External ear normal.  Left Ear: External ear normal.  Nose: Nose normal.  Mouth/Throat: Oropharynx is clear and moist.  Eyes: Pupils are equal, round, and reactive to light. Conjunctivae are normal.  Neck: Normal range of motion.  Cardiovascular: Normal heart sounds.   Pulmonary/Chest: Breath sounds normal.  Musculoskeletal:       Left shoulder: She exhibits decreased range of motion and tenderness. She exhibits no bony tenderness and normal pulse.       Arms: Patient unable to actively abduct more than 45 degrees from vertical, and active abduction no more than 60 degrees. Unable to perform Empty can test or Apley's test. Internal external rotation strength is adequate.   There is distinct tenderness to palpation over the insertion of the long head of the biceps tendon.  Yergason's test is positive. Distal neurovascular function is intact.    Neurological: She is alert.  Skin: Skin is warm and dry.  Nursing note and vitals reviewed.    UC Treatments / Results  Labs (all labs ordered are listed, but only abnormal results are displayed) Labs Reviewed - No data to display  EKG  EKG Interpretation None       Radiology Dg Shoulder Left  Result Date: 04/22/2017 CLINICAL DATA:  Left shoulder pain, no known injury EXAM: LEFT SHOULDER - 2+ VIEW COMPARISON:  None. FINDINGS: There is no evidence of fracture or dislocation. There is no evidence of arthropathy or other focal bone abnormality. Soft tissues are unremarkable. IMPRESSION: No acute osseous injury of the left shoulder. Electronically Signed   By: Kathreen Devoid   On: 04/22/2017 12:48    Procedures Procedures  (including critical care time)  Medications Ordered in UC Medications  ibuprofen (ADVIL,MOTRIN) tablet 600 mg (600 mg Oral Given 04/22/17 1224)     Initial Impression / Assessment and Plan / UC Course  I have reviewed the triage vital signs and the nursing notes.  Pertinent labs & imaging results that were available during  my care of the patient were reviewed by me and considered in my medical decision making (see chart for details).    Suspect rotator cuff tendonopathy. Rx for Mobic 15mg  daily.  Rx for Lortab at bedtime (#12, no refill) Apply ice pack for 15 to 20 minutes, 3 to 4 times daily  Continue until pain and swelling decrease.  Followup with Dr. Aundria Mems or Dr. Lynne Leader ([facility]) as soon as possible for further evaluation/treatment.  Controlled Substance Prescriptions I have consulted the [place] Controlled Substances Registry for this patient, and feel the risk/benefit ratio today is favorable for proceeding with this prescription for a controlled substance.     Final Clinical Impressions(s) / UC Diagnoses   Final diagnoses:  Biceps tendonitis, left  Acute pain of left shoulder    New Prescriptions New Prescriptions   HYDROCODONE-ACETAMINOPHEN (NORCO/VICODIN) 5-325 MG TABLET    Take 1 tablet by mouth every 6 (six) hours as needed for moderate pain.   MELOXICAM (MOBIC) 15 MG TABLET    Take 1 tablet (15 mg total) by mouth daily. Take with food each morning         Kandra Nicolas, MD 04/22/17 1352

## 2017-04-22 NOTE — ED Triage Notes (Signed)
Patient c/o 1 week of left shoulder pain without injury. Pain is constant. No previous injury no numbness. Taken Aleve with some relief.

## 2017-04-22 NOTE — Discharge Instructions (Signed)
Apply ice pack for 15 to 20 minutes, 3 to 4 times daily  Continue until pain and swelling decrease.

## 2017-04-25 ENCOUNTER — Ambulatory Visit (INDEPENDENT_AMBULATORY_CARE_PROVIDER_SITE_OTHER): Payer: 59 | Admitting: Family Medicine

## 2017-04-25 ENCOUNTER — Encounter: Payer: Self-pay | Admitting: Family Medicine

## 2017-04-25 DIAGNOSIS — M7582 Other shoulder lesions, left shoulder: Secondary | ICD-10-CM

## 2017-04-25 MED ORDER — DICLOFENAC SODIUM 1 % TD GEL: 2.0000 g | Freq: Four times a day (QID) | TRANSDERMAL | 11 refills | Status: DC

## 2017-04-25 MED ORDER — MELOXICAM 15 MG PO TABS: 15.0000 mg | ORAL_TABLET | Freq: Every day | ORAL | 1 refills | Status: DC

## 2017-04-25 MED ORDER — OMEPRAZOLE 40 MG PO CPDR: 40.0000 mg | DELAYED_RELEASE_CAPSULE | Freq: Every day | ORAL | 3 refills | Status: DC

## 2017-04-25 NOTE — Progress Notes (Signed)
Subjective:    I'm seeing this patient as a consultation for:  Dr Assunta Found  CC: Left Shoulder Pain  HPI: Patient has a one-week history of left shoulder pain. She notes pain in the lateral upper arm worse with overhead motion and reaching back. 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 She was seen in urgent care on August 18. X-rays at that time are unremarkable. She was thought to have potentially biceps tendinitis and was prescribed meloxicam and hydrocodone. She notes the meloxicam has helped considerably although she continues to experience moderate pain. The pain does interfere with her ability to take care of things at home and is somewhat interfere with her ability to work. She denies any neck injury radiating pain weakness or numbness.  Additionally she is seeking out a primary care physician and will likely establish care with my partner Dr. Sheppard Coil.  Past medical history, Surgical history, Family history not pertinant except as noted below, Social history, Allergies, and medications have been entered into the medical record, reviewed, and no changes needed.   Review of Systems: No headache, visual changes, nausea, vomiting, diarrhea, constipation, dizziness, abdominal pain, skin rash, fevers, chills, night sweats, weight loss, swollen lymph nodes, body aches, joint swelling, muscle aches, chest pain, shortness of breath, mood changes, visual or auditory hallucinations.   Objective:    Vitals:   04/25/17 0958  BP: 126/77  Pulse: 76   General: Well Developed, well nourished, and in no acute distress.  Neuro/Psych: Alert and oriented x3, extra-ocular muscles intact, able to move all 4 extremities, sensation grossly intact. Skin: Warm and dry, no rashes noted.  Respiratory: Not using accessory muscles, speaking in full sentences, trachea midline.  Cardiovascular: Pulses palpable, no extremity edema. Abdomen: Does  not appear distended. MSK:  C-spine nontender to midline normal neck motion. Left shoulder normal-appearing mildly tender to palpation in bicipital groove. Range of motion limited active abduction to 30. Full passive motion Limited active forward flexion to 80. Full passive motion. Normal external range of motion. Internal range of motion limited to the lumbar spine. Strength is diminished by pain especially with abduction. 4/5.  Somewhat limited by pain internal rotation 4+/5 Normal external rotation strength Positive empty can test. Positive Hawkins and Neer's test. Negative Yergason's and speeds test. Pulses capillary refill and sensation are intact  Limited musculoskeletal ultrasound of the left shoulder Biceps tendon is intact and in the bicipital groove with no obvious tears. The subscapularis tendon is degenerative appearing with no obvious full-thickness tears. Increased vascular activity superficial to the   subscapularis tendon indicates tendinopathy. Normal supraspinatus tendon appearance. Normal infraspinatus tendon appearance Moderate acromioclavicular joint effusion Normal bony structures   No results found for this or any previous visit (from the past 24 hour(s)). No results found.  Impression and Recommendations:    Assessment and Plan: 56 y.o. female with  Left shoulder pain. Likely rotator cuff tendinopathy. Up suspicious for subscapularis and supraspinatus tendon involvement based on ultrasound and exam. We discussed options. Plan for trial of physical therapy with home exercises diclofenac gel and oral meloxicam. I have also prescribed omeprazole for GI prophylaxis on oral meloxicam. I refer patient to physical therapy. We'll recheck in 6 weeks or sooner if needed. We had a lengthy discussion about pros and cons of steroid injection today.  Additionally patient will establish care with Dr. Sheppard Coil to address her other health issues including  hypothyroidism.   Orders  Placed This Encounter  Procedures  . Ambulatory referral to Physical Therapy    Referral Priority:   Routine    Referral Type:   Physical Medicine    Referral Reason:   Specialty Services Required    Requested Specialty:   Physical Therapy   Meds ordered this encounter  Medications  . DISCONTD: diclofenac sodium (VOLTAREN) 1 % GEL    Sig: Apply 2 g topically 4 (four) times daily. To affected joint.    Dispense:  100 g    Refill:  11  . diclofenac sodium (VOLTAREN) 1 % GEL    Sig: Apply 2 g topically 4 (four) times daily. To affected joint.    Dispense:  100 g    Refill:  11  . DISCONTD: meloxicam (MOBIC) 15 MG tablet    Sig: Take 1 tablet (15 mg total) by mouth daily. Take with food each morning    Dispense:  30 tablet    Refill:  1  . omeprazole (PRILOSEC) 40 MG capsule    Sig: Take 1 capsule (40 mg total) by mouth daily.    Dispense:  30 capsule    Refill:  3  . meloxicam (MOBIC) 15 MG tablet    Sig: Take 1 tablet (15 mg total) by mouth daily. Take with food each morning    Dispense:  30 tablet    Refill:  1    Discussed warning signs or symptoms. Please see discharge instructions. Patient expresses understanding.

## 2017-04-25 NOTE — Patient Instructions (Addendum)
Thank you for coming in today. Apply the diclofenac gel up to 4x daily for pain.  Attend PT Do the home exercises.  Bring the arm up to the front Bring the arm up to the side.  With the band rotation the arm in.  With the band rotate the arm out  Take Meloxicam daily for pain as needed.  Take it with omeprazole for stomach protection.   Recheck with me in 6 weeks.   Schedule a new patient appointment with Dr Sheppard Coil to establish care.    Shoulder Impingement Syndrome Shoulder impingement syndrome is a condition that causes pain when connective tissues (tendons) surrounding the shoulder joint become pinched. These tendons are part of the group of muscles and tissues that help to stabilize the shoulder (rotator cuff). Beneath the rotator cuff is a fluid-filled sac (bursa) that allows the muscles and tendons to glide smoothly. The bursa may become swollen or irritated (bursitis). Bursitis, swelling in the rotator cuff tendons, or both conditions can decrease how much space is under a bone in the shoulder joint (acromion), resulting in impingement. What are the causes? Shoulder impingement syndrome can be caused by bursitis or swelling of the rotator cuff tendons, which may result from:  Repetitive overhead arm movements.  Falling onto the shoulder.  Weakness in the shoulder muscles.  What increases the risk? You may be more likely to develop this condition if you are an athlete who participates in:  Sports that involve throwing, such as baseball.  Tennis.  Swimming.  Volleyball.  Some people are also more likely to develop impingement syndrome because of the shape of their acromion bone. What are the signs or symptoms? The main symptom of this condition is pain on the front or side of the shoulder. Pain may:  Get worse when lifting or raising the arm.  Get worse at night.  Wake you up from sleeping.  Feel sharp when the shoulder is moved, and then fade to an  ache.  Other signs and symptoms may include:  Tenderness.  Stiffness.  Inability to raise the arm above shoulder level or behind the body.  Weakness.  How is this diagnosed? This condition may be diagnosed based on:  Your symptoms.  Your medical history.  A physical exam.  Imaging tests, such as: ? X-rays. ? MRI. ? Ultrasound.  How is this treated? Treatment for this condition may include:  Resting your shoulder and avoiding all activities that cause pain or put stress on the shoulder.  Icing your shoulder.  NSAIDs to help reduce pain and swelling.  One or more injections of medicines to numb the area and reduce inflammation.  Physical therapy.  Surgery. This may be needed if nonsurgical treatments have not helped. Surgery may involve repairing the rotator cuff, reshaping the acromion, or removing the bursa.  Follow these instructions at home: Managing pain, stiffness, and swelling  If directed, apply ice to the injured area. ? Put ice in a plastic bag. ? Place a towel between your skin and the bag. ? Leave the ice on for 20 minutes, 2-3 times a day. Activity  Rest and return to your normal activities as told by your health care provider. Ask your health care provider what activities are safe for you.  Do exercises as told by your health care provider. General instructions  Do not use any tobacco products, including cigarettes, chewing tobacco, or e-cigarettes. Tobacco can delay healing. If you need help quitting, ask your health care provider.  Ask your health care provider when it is safe for you to drive.  Take over-the-counter and prescription medicines only as told by your health care provider.  Keep all follow-up visits as told by your health care provider. This is important. How is this prevented?  Give your body time to rest between periods of activity.  Be safe and responsible while being active to avoid falls.  Maintain physical fitness,  including strength and flexibility. Contact a health care provider if:  Your symptoms have not improved after 1-2 months of treatment and rest.  You cannot lift your arm away from your body. This information is not intended to replace advice given to you by your health care provider. Make sure you discuss any questions you have with your health care provider. Document Released: 08/22/2005 Document Revised: 04/28/2016 Document Reviewed: 07/25/2015 Elsevier Interactive Patient Education  [employer].

## 2017-05-01 ENCOUNTER — Ambulatory Visit (INDEPENDENT_AMBULATORY_CARE_PROVIDER_SITE_OTHER): Payer: 59 | Admitting: Physical Therapy

## 2017-05-01 DIAGNOSIS — R293 Abnormal posture: Secondary | ICD-10-CM

## 2017-05-01 DIAGNOSIS — M25612 Stiffness of left shoulder, not elsewhere classified: Secondary | ICD-10-CM

## 2017-05-01 DIAGNOSIS — M25512 Pain in left shoulder: Secondary | ICD-10-CM

## 2017-05-01 NOTE — Therapy (Signed)
[place], [place], [address] Phone: [phone]   Fax:  [phone]  Physical Therapy Evaluation  Patient Details  Name: Colleen Carter MRN: [id number] Date of Birth: [birth year] Referring Provider: Dr. Lynne Leader  Encounter Date: 05/01/2017      PT End of Session - 05/01/17 1244    Visit Number 1   Number of Visits 12   Date for PT Re-Evaluation 06/12/17   Authorization Type [employer] $17 copay; 60 visit limit   PT Start Time 1146   PT Stop Time 1228   PT Time Calculation (min) 42 min   Activity Tolerance Patient tolerated treatment well   Behavior During Therapy [facility] for tasks assessed/performed      Past Medical History:  Diagnosis Date  . Anginal pain ([place])    turned out to be muscular  . Carpal tunnel syndrome   . GERD (gastroesophageal reflux disease)   . Hypothyroidism     Past Surgical History:  Procedure Laterality Date  . BREAST SURGERY     breast reduction  . CARPAL TUNNEL RELEASE Right   . CARPAL TUNNEL RELEASE Left 11/27/2015   Procedure: LEFT LIMITED OPEN CARPAL TUNNEL RELEASE;  Surgeon: Roseanne Kaufman, MD;  Location: [facility];  Service: Orthopedics;  Laterality: Left;  . TOTAL THYROIDECTOMY    . TUBAL LIGATION      There were no vitals filed for this visit.       Subjective Assessment - 05/01/17 1149    Subjective Pt is a 56 y/o female who presents to OPPT for Lt shoulder pain x 2 wks.  Pt reports symptoms seem to be getting better (possibly due to pain medication), and is unable to determine MOI.  Pt does report some repetitive movements with equipment at work which could be cause.   Limitations House hold activities  difficulty with ADLs   Diagnostic tests U/S subscapularis tendinopathy   Patient Stated Goals improve pain; be able to reach behind back   Currently in Pain? Yes   Pain Score 0-No pain  up to 10/10 (pt reports she went to urgent care)   Pain Location Shoulder   Pain Orientation Left   Pain Descriptors / Indicators Aching;Throbbing   Pain Type Acute pain   Pain Onset 1 to 4 weeks ago   Pain Frequency Intermittent   Aggravating Factors  reaching behind back, some overhead reaching   Pain Relieving Factors has a few exercises at home from MD, medication, ice            [facility] PT Assessment - 05/01/17 1154      Assessment   Medical Diagnosis Lt rotator cuff tendinopathy   Referring Provider Dr. Lynne Leader   Onset Date/Surgical Date 04/17/17   Hand Dominance Right   Next MD Visit 06/13/17   Prior Therapy unrelated to this condition     Precautions   Precautions None     Restrictions   Weight Bearing Restrictions No     Balance Screen   Has the patient fallen in the past 6 months No   Has the patient had a decrease in activity level because of a fear of falling?  No   Is the patient reluctant to leave their home because of a fear of falling?  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   Leisure Data processing manager   Overall Cognitive Status Within Functional Limits for tasks assessed     Observation/Other Assessments   Focus on Therapeutic Outcomes (FOTO)  69 (31% limited; predicted 23% limited)     Posture/Postural Control   Posture/Postural Control Postural limitations   Postural Limitations Rounded Shoulders;Forward head;Increased thoracic kyphosis     ROM / Strength   AROM / PROM / Strength AROM;Strength;PROM     AROM   Overall AROM Comments Rt shoulder WNL   AROM Assessment Site Shoulder   Right/Left Shoulder Left   Left  Shoulder Flexion 150 Degrees   Left Shoulder ABduction 134 Degrees   Left Shoulder Internal Rotation 75 Degrees  Functional: to L5   Left Shoulder External Rotation 65 Degrees     PROM   PROM Assessment Site Shoulder   Right/Left Shoulder Left   Left Shoulder ABduction 180 Degrees   Left Shoulder Internal Rotation 85 Degrees   Left Shoulder External Rotation 70 Degrees     Strength   Strength Assessment Site Shoulder   Right/Left Shoulder Left   Left Shoulder Flexion 3/5   Left Shoulder ABduction 3-/5   Left Shoulder Internal Rotation 4/5  no pain with resistance   Left Shoulder External Rotation 3+/5  pain with resistance     Palpation   Palpation comment trigger points noted in Lt supraspinatus            Objective measurements completed on examination: See above findings.          [facility] Adult PT Treatment/Exercise - 05/01/17 1154      Self-Care   Self-Care Other Self-Care Comments   Other Self-Care Comments  educated on ionto; possible TDN, and HEP from MD office (pt has blue theraband, and no pictures).  Carter plan to give HEP next session     Modalities   Modalities Iontophoresis     Iontophoresis   Type of Iontophoresis Dexamethasone   Location Lt supraspinatus   Dose 1.0 cc   Time 6 hour patch                PT Education - 05/01/17 1244    Education provided Yes   Education Details ionto, TDN, see self care   Person(s) Educated Patient   Methods Explanation;Handout   Comprehension Verbalized understanding             PT Long Term Goals - 05/01/17 1304      PT LONG TERM GOAL #1   Title independent with HEP    Time 6   Period Weeks   Status New   Target Date 06/12/17     PT LONG TERM GOAL #2   Title improve active Lt shoulder abduction to at least 160 degrees for improved function   Time 6   Period Weeks   Status New   Target Date 06/12/17     PT LONG TERM GOAL #3   Title improve functional internal rotation to at  least T10 for improved UB dressing   Time 6   Period Weeks   Status New   Target Date 06/12/17     PT LONG TERM GOAL #4   Title report pain < 4/10  with activity for improved function and decreased pain   Time 6   Period Weeks   Status New   Target Date 06/12/17     PT LONG TERM GOAL #5   Title demonstrate at least 4/5 strength Lt shoulder for improved function   Time 6   Period Weeks   Status New                Plan - 05/01/17 1246    Clinical Impression Statement Pt is a 56 y/o female who presents to OPPT for Lt shoulder pain x 2 weeks, consistent with RTC tendinopathy.  Pt demonstrates active trigger points in supraspinatus, decreased ROM and strength as well as postural abnormalities affecting ADLs.  Carter benefit from PT to address these deficits.   Clinical Presentation Stable   Clinical Decision Making Low   Rehab Potential Good   PT Frequency 2x / week   PT Duration 6 weeks   PT Treatment/Interventions ADLs/Self Care Home Management;Cryotherapy;Electrical Stimulation;Iontophoresis 4mg /ml Dexamethasone;Moist Heat;Ultrasound;Therapeutic exercise;Therapeutic activities;Patient/family education;Manual techniques;Passive range of motion;Vasopneumatic Device;Taping;Dry needling   PT Next Visit Plan assess response to ionto, consider TDN subscap and supraspinatus, establish HEP (reports MD gave her blue theraband, no pictures)   Consulted and Agree with Plan of Care Patient      Patient Carter benefit from skilled therapeutic intervention in order to improve the following deficits and impairments:  Pain, Decreased strength, Decreased range of motion, Postural dysfunction, Increased muscle spasms, Increased fascial restricitons, Impaired UE functional use  Visit Diagnosis: Acute pain of left shoulder - Plan: PT plan of care cert/re-cert  Stiffness of left shoulder, not elsewhere classified - Plan: PT plan of care cert/re-cert  Abnormal posture - Plan: PT plan of care  cert/re-cert     Problem List Patient Active Problem List   Diagnosis Date Noted  . Rotator cuff tendonitis, left 04/25/2017  . Hypothyroidism (acquired) 03/07/2014  . Skin ulcer ([place]) 04/03/2012      Laureen Abrahams, PT, DPT 05/01/17 1:16 PM    [facility] [place], [place], [address] Phone: [phone]   Fax:  [phone]  Name: Colleen Carter MRN: [id number] Date of Birth: 05/30/1961

## 2017-05-01 NOTE — Patient Instructions (Addendum)
IONTOPHORESIS PATIENT PRECAUTIONS & CONTRAINDICATIONS:  . Redness under one or both electrodes can occur.  This characterized by a uniform redness that usually disappears within 12 hours of treatment. . Small pinhead size blisters may result in response to the drug.  Contact your physician if the problem persists more than 24 hours. . On rare occasions, iontophoresis therapy can result in temporary skin reactions such as rash, inflammation, irritation or burns.  The skin reactions may be the result of individual sensitivity to the ionic solution used, the condition of the skin at the start of treatment, reaction to the materials in the electrodes, allergies or sensitivity to dexamethasone, or a poor connection between the patch and your skin.  Discontinue using iontophoresis if you have any of these reactions and report to your therapist. . Remove the Patch or electrodes if you have any undue sensation of pain or burning during the treatment and report discomfort to your therapist. . Tell your Therapist if you have had known adverse reactions to the application of electrical current. . If using the Patch, the LED light will turn off when treatment is complete and the patch can be removed.  Approximate treatment time is 1-3 hours.  Remove the patch when light goes off or after 6 hours. . The Patch can be worn during normal activity, however excessive motion where the electrodes have been placed can cause poor contact between the skin and the electrode or uneven electrical current resulting in greater risk of skin irritation. Marland Kitchen Keep out of the reach of children.   . DO NOT use if you have a cardiac pacemaker or any other electrically sensitive implanted device. . DO NOT use if you have a known sensitivity to dexamethasone. . DO NOT use during Magnetic Resonance Imaging (MRI). . DO NOT use over broken or compromised skin (e.g. sunburn, cuts, or acne) due to the increased risk of skin reaction. . DO  NOT SHAVE over the area to be treated:  To establish good contact between the Patch and the skin, excessive hair may be clipped. . DO NOT place the Patch or electrodes on or over your eyes, directly over your heart, or brain. . DO NOT reuse the Patch or electrodes as this may cause burns to occur.   Trigger Point Dry Needling  . What is Trigger Point Dry Needling (DN)? o DN is a physical therapy technique used to treat muscle pain and dysfunction. Specifically, DN helps deactivate muscle trigger points (muscle knots).  o A thin filiform needle is used to penetrate the skin and stimulate the underlying trigger point. The goal is for a local twitch response (LTR) to occur and for the trigger point to relax. No medication of any kind is injected during the procedure.   . What Does Trigger Point Dry Needling Feel Like?  o The procedure feels different for each individual patient. Some patients report that they do not actually feel the needle enter the skin and overall the process is not painful. Very mild bleeding may occur. However, many patients feel a deep cramping in the muscle in which the needle was inserted. This is the local twitch response.   Marland Kitchen How Will I feel after the treatment? o Soreness is normal, and the onset of soreness may not occur for a few hours. Typically this soreness does not last longer than two days.  o Bruising is uncommon, however; ice can be used to decrease any possible bruising.  o In rare cases feeling tired  nauseous after the treatment is normal. In addition, your symptoms may get worse before they get better, this period will typically not last longer than 24 hours.   . What Can I do After My Treatment? o Increase your hydration by drinking more water for the next 24 hours. o You may place ice or heat on the areas treated that have become sore, however, do not use heat on inflamed or bruised areas. Heat often brings more relief post needling. o You can continue  your regular activities, but vigorous activity is not recommended initially after the treatment for 24 hours. o DN is best combined with other physical therapy such as strengthening, stretching, and other therapies.   

## 2017-05-10 ENCOUNTER — Encounter: Payer: 59 | Admitting: Physical Therapy

## 2017-05-12 ENCOUNTER — Ambulatory Visit (INDEPENDENT_AMBULATORY_CARE_PROVIDER_SITE_OTHER): Payer: 59 | Admitting: Rehabilitative and Restorative Service Providers"

## 2017-05-12 ENCOUNTER — Encounter: Payer: Self-pay | Admitting: Rehabilitative and Restorative Service Providers"

## 2017-05-12 DIAGNOSIS — M25512 Pain in left shoulder: Secondary | ICD-10-CM | POA: Diagnosis not present

## 2017-05-12 DIAGNOSIS — R293 Abnormal posture: Secondary | ICD-10-CM | POA: Diagnosis not present

## 2017-05-12 DIAGNOSIS — M25612 Stiffness of left shoulder, not elsewhere classified: Secondary | ICD-10-CM | POA: Diagnosis not present

## 2017-05-12 NOTE — Patient Instructions (Addendum)
Shoulder Blade Squeeze    Rotate shoulders back, then squeeze shoulder blades down and back. Hold 10 sec Repeat __10__ times. Do _several ___ sessions per day. Can use swim noodle to cue the correct muscles   Axial Extension (Chin Tuck)    Pull chin in and lengthen back of neck. Hold _5-10___ seconds while counting out loud. Repeat __5__ times. Do _several ___ sessions per day.   Scapula Adduction With Pectoralis Stretch: Low - Standing   Shoulders at 45 hands even with shoulders, keeping weight through legs, shift weight forward until you feel pull or stretch through the front of your chest. Hold _30__ seconds. Do _3__ times, _2-4__ times per day.   Scapula Adduction With Pectoralis Stretch: Mid-Range - Standing   Shoulders at 90 elbows even with shoulders, keeping weight through legs, shift weight forward until you feel pull or strength through the front of your chest. Hold __30_ seconds. Do _3__ times, __2-4_ times per day.   Scapula Adduction With Pectoralis Stretch: High - Standing   Shoulders at 120 hands up high on the doorway, keeping weight on feet, shift weight forward until you feel pull or stretch through the front of your chest. Hold _30__ seconds. Do _3__ times, _2-3__ times per day.  ELBOW: Biceps - Standing    Standing in doorway, place one hand on wall, elbow straight. Lean forward. Hold _20-30__ seconds. __3_ reps 2-3 times per day    Self massage using ~3 inch ball working through the front and back of the shoulder area   SUPINE Tips A    Being in the supine position means to be lying on the back. Lying on the back is the position of least compression on the bones and discs of the spine, and helps to re-align the natural curves of the back. Resting arms on bed 80 deg to 120 deg for 3-5 min

## 2017-05-12 NOTE — Therapy (Addendum)
[place], [place], [address] Phone: [phone]   Fax:  [phone]  Physical Therapy Treatment  Patient Details  Name: Colleen Carter MRN: [id number] Date of Birth: 1960/09/15 Referring Provider: Dr Lynne Leader   Encounter Date: 05/12/2017      PT End of Session - 05/12/17 1103    Visit Number 2   Number of Visits 12   Date for PT Re-Evaluation 06/12/17   Authorization Type [employer] $17 copay; 60 visit limit   PT Start Time 1100   PT Stop Time 1150   PT Time Calculation (min) 50 min   Activity Tolerance Patient tolerated treatment well      Past Medical History:  Diagnosis Date  . Anginal pain ([place])    turned out to be muscular  . Carpal tunnel syndrome   . GERD (gastroesophageal reflux disease)   . Hypothyroidism     Past Surgical History:  Procedure Laterality Date  . BREAST SURGERY     breast reduction  . CARPAL TUNNEL RELEASE Right   . CARPAL TUNNEL RELEASE Left 11/27/2015   Procedure: LEFT LIMITED OPEN CARPAL TUNNEL RELEASE;  Surgeon: Roseanne Kaufman, MD;  Location: [facility];  Service: Orthopedics;  Laterality: Left;  . TOTAL THYROIDECTOMY    . TUBAL LIGATION      There were no vitals filed for this visit.      Subjective Assessment - 05/12/17 1109    Subjective Lt shoulder is feeling much better. Can hook her bra now. no pain    Currently in Pain? No/denies            [facility] PT Assessment - 05/12/17 0001      Assessment   Medical Diagnosis Lt rotator cuff tendinopathy   Referring Provider Dr Lynne Leader    Onset Date/Surgical Date 04/17/17   Hand Dominance Right   Next MD Visit 06/13/17   Prior Therapy unrelated to this condition     AROM   Left Shoulder Flexion 155 Degrees   Left Shoulder ABduction 148 Degrees     Palpation   Palpation comment tight pecs; biceps; posterior shoulder girdle LT upper quarter                      [facility]  Adult PT Treatment/Exercise - 05/12/17 0001      Therapeutic Activites    Therapeutic Activities --  myofacial ball release work      Neuro Re-ed    Neuro Re-ed Details  postural correction working to engage posterior shoudler girdle musculature      Shoulder Exercises: Supine   Other Supine Exercises snow angel 2-3 min arms at ~ 80 deg      Shoulder Exercises: Standing   Other Standing Exercises scap squeeze with noodle 10 sec x 10   Other Standing Exercises axial extension 10 sec x 5      Shoulder Exercises: Stretch   Other Shoulder Stretches 3 way doorway stretch 30 sec x 3 each    Other Shoulder Stretches biceps stretch 20 sec x 2      Modalities   Modalities Iontophoresis     Iontophoresis   Type of Iontophoresis Dexamethasone   Location Lt supraspinatus   Dose 1.0 cc   Time 6 hour patch      treatment also included manual work through [employer] shoulder girdle with patient in supine position x 14 min focus through the pecs and anterior  shoulder          PT Education - 05/12/17 1144    Education provided Yes   Education Details HEP    Person(s) Educated Patient   Methods Explanation;Demonstration;Tactile cues;Verbal cues;Handout   Comprehension Verbalized understanding;Returned demonstration;Verbal cues required;Tactile cues required             PT Long Term Goals - 05/12/17 1155      PT LONG TERM GOAL #1   Title independent with HEP    Time 6   Period Weeks   Status On-going     PT LONG TERM GOAL #2   Title improve active Lt shoulder abduction to at least 160 degrees for improved function   Time 6   Period Weeks   Status On-going     PT LONG TERM GOAL #3   Title improve functional internal rotation to at least T10 for improved UB dressing   Time 6   Period Weeks   Status On-going     PT LONG TERM GOAL #4   Title report pain < 4/10 with activity for improved function and decreased pain   Time 6   Period Weeks   Status On-going     PT LONG  TERM GOAL #5   Title demonstrate at least 4/5 strength Lt shoulder for improved function   Time 6   Period Weeks   Status Unable to assess               Plan - 05/12/17 1150    Clinical Impression Statement Excellent progress with shoudler rehab. Added stretching without difficulty. Continues to have muscular tightness through the anterior Lt shoulder including pecs and biceps. She has poor posture and alignment with poor activation of posterior shoudler girdle musculature.    Rehab Potential Good   PT Frequency 2x / week   PT Duration 6 weeks   PT Treatment/Interventions ADLs/Self Care Home Management;Cryotherapy;Electrical Stimulation;Iontophoresis 4mg /ml Dexamethasone;Moist Heat;Ultrasound;Therapeutic exercise;Therapeutic activities;Patient/family education;Manual techniques;Passive range of motion;Vasopneumatic Device;Taping;Dry needling   PT Next Visit Plan continue ionto, consider TDN subscap and supraspinatus, establish HEP (reports MD gave her blue theraband, no pictures) will add TB exercises at next visit   Consulted and Agree with Plan of Care Patient      Patient will benefit from skilled therapeutic intervention in order to improve the following deficits and impairments:  Pain, Decreased strength, Decreased range of motion, Postural dysfunction, Increased muscle spasms, Increased fascial restricitons, Impaired UE functional use  Visit Diagnosis: Acute pain of left shoulder  Stiffness of left shoulder, not elsewhere classified  Abnormal posture     Problem List Patient Active Problem List   Diagnosis Date Noted  . Rotator cuff tendonitis, left 04/25/2017  . Hypothyroidism (acquired) 03/07/2014  . Skin ulcer ([place]) 04/03/2012    Giulio Bertino Nilda Simmer PT, MPH  05/12/2017, 12:00 PM  [facility] [place], [place], [address] Phone: [phone]   Fax:  [phone]  Name: Colleen Carter MRN:  [id number] Date of Birth: Mar 03, 1961

## 2017-05-16 ENCOUNTER — Encounter: Payer: 59 | Admitting: Rehabilitative and Restorative Service Providers"

## 2017-05-19 ENCOUNTER — Encounter: Payer: Self-pay | Admitting: Rehabilitative and Restorative Service Providers"

## 2017-05-19 ENCOUNTER — Ambulatory Visit (INDEPENDENT_AMBULATORY_CARE_PROVIDER_SITE_OTHER): Payer: 59 | Admitting: Rehabilitative and Restorative Service Providers"

## 2017-05-19 DIAGNOSIS — R293 Abnormal posture: Secondary | ICD-10-CM | POA: Diagnosis not present

## 2017-05-19 DIAGNOSIS — M25612 Stiffness of left shoulder, not elsewhere classified: Secondary | ICD-10-CM | POA: Diagnosis not present

## 2017-05-19 DIAGNOSIS — M25512 Pain in left shoulder: Secondary | ICD-10-CM | POA: Diagnosis not present

## 2017-05-19 NOTE — Therapy (Addendum)
[place], [place], [address] Phone: [phone]   Fax:  [phone]  Physical Therapy Treatment  Patient Details  Name: Colleen Carter MRN: [id number] Date of Birth: 1961/03/14 Referring Provider: Dr Lynne Leader  Encounter Date: 05/19/2017      PT End of Session - 05/19/17 1102    Visit Number 3   Number of Visits 12   Date for PT Re-Evaluation 06/12/17   Authorization Type [employer] $17 copay; 60 visit limit   PT Start Time 1100   PT Stop Time 1152   PT Time Calculation (min) 52 min   Activity Tolerance Patient tolerated treatment well      Past Medical History:  Diagnosis Date  . Anginal pain ([place])    turned out to be muscular  . Carpal tunnel syndrome   . GERD (gastroesophageal reflux disease)   . Hypothyroidism     Past Surgical History:  Procedure Laterality Date  . BREAST SURGERY     breast reduction  . CARPAL TUNNEL RELEASE Right   . CARPAL TUNNEL RELEASE Left 11/27/2015   Procedure: LEFT LIMITED OPEN CARPAL TUNNEL RELEASE;  Surgeon: Roseanne Kaufman, MD;  Location: [facility];  Service: Orthopedics;  Laterality: Left;  . TOTAL THYROIDECTOMY    . TUBAL LIGATION      There were no vitals filed for this visit.      Subjective Assessment - 05/19/17 1102    Subjective Lt shoulder is still irritated. Has a sharp pain at times. 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 Really doesn't have pain except with these tasks at work.    Currently in Pain? No/denies            [facility] PT Assessment - 05/19/17 0001      Assessment   Medical Diagnosis Lt rotator cuff tendinopathy   Referring Provider Dr Lynne Leader   Onset Date/Surgical Date 04/17/17   Hand Dominance Right   Next MD Visit 06/13/17   Prior Therapy unrelated to this condition     AROM   Left Shoulder Flexion 160 Degrees   Left Shoulder ABduction  164 Degrees     Strength   Left Shoulder Flexion --  5-/5   Left Shoulder ABduction 5/5   Left Shoulder Internal Rotation 5/5   Left Shoulder External Rotation --  5-/5     Palpation   Palpation comment tight pecs; biceps; posterior shoulder girdle LT upper quarter                      [facility] Adult PT Treatment/Exercise - 05/19/17 0001      Shoulder Exercises: Supine   Other Supine Exercises snow angel 2-3 min arms at ~ 80 deg      Shoulder Exercises: Standing   External Rotation Strengthening;Left;20 reps;Theraband   Theraband Level (Shoulder External Rotation) Level 2 (Red)   Extension Strengthening;Both;20 reps;Theraband   Theraband Level (Shoulder Extension) Level 2 (Red)   Row Strengthening;Both;20 reps;Theraband   Theraband Level (Shoulder Row) Level 2 (Red)   Retraction Strengthening;Both;20 reps;Theraband  w noodle    Theraband Level (Shoulder Retraction) Level 1 (Yellow)   Other Standing Exercises scap squeeze with noodle 10 sec x 10   Other Standing Exercises axial extension 10 sec x 5      Shoulder Exercises: Stretch   Other Shoulder Stretches 3 way doorway stretch 30  sec x 3 each    Other Shoulder Stretches biceps stretch 20 sec x 2      Modalities   Modalities Iontophoresis     Moist Heat Therapy   Number Minutes Moist Heat 20 Minutes   Moist Heat Location Shoulder  Lt     Electrical Stimulation   Electrical Stimulation Location Lt shoulder   Electrical Stimulation Action IFC   Electrical Stimulation Parameters to tolerance   Electrical Stimulation Goals Pain;Tone     Iontophoresis   Type of Iontophoresis Dexamethasone   Location Lt supraspinatus   Dose 1.0 cc; 60 mApm    Time 6 hour patch                PT Education - 05/19/17 1130    Education provided Yes   Education Details HEP   Person(s) Educated Patient   Methods Explanation;Demonstration;Tactile cues;Verbal cues;Handout   Comprehension Verbalized  understanding;Returned demonstration;Verbal cues required;Tactile cues required             PT Long Term Goals - 05/19/17 1136      PT LONG TERM GOAL #1   Title independent with HEP    Time 6   Period Weeks   Status On-going     PT LONG TERM GOAL #2   Title improve active Lt shoulder abduction to at least 160 degrees for improved function   Time 6   Period Weeks   Status Partially Met     PT LONG TERM GOAL #3   Title improve functional internal rotation to at least T10 for improved UB dressing   Time 6   Period Weeks   Status Achieved     PT LONG TERM GOAL #4   Title report pain < 4/10 with activity for improved function and decreased pain   Time 6   Period Weeks   Status On-going     PT LONG TERM GOAL #5   Title demonstrate at least 4/5 strength Lt shoulder for improved function   Time 6   Period Weeks   Status On-going               Plan - 05/19/17 1133    Clinical Impression Statement Excellent progress with ROM and strength Lt shoulder. 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 She reports increased soreness following manual work last visit. Not sure she wants to try the DN will think about it.    Rehab Potential Good   PT Frequency 2x / week   PT Duration 6 weeks   PT Treatment/Interventions ADLs/Self Care Home Management;Cryotherapy;Electrical Stimulation;Iontophoresis 71m/ml Dexamethasone;Moist Heat;Ultrasound;Therapeutic exercise;Therapeutic activities;Patient/family education;Manual techniques;Passive range of motion;Vasopneumatic Device;Taping;Dry needling   PT Next Visit Plan continue ionto, consider TDN subscap and supraspinatus, assess response to TB exercises; continue to work on posture engaging posterior shoulder girdle musculature.    Consulted and Agree with Plan of Care Patient      Patient will benefit from skilled therapeutic intervention in  order to improve the following deficits and impairments:  Pain, Decreased strength, Decreased range of motion, Postural dysfunction, Increased muscle spasms, Increased fascial restricitons, Impaired UE functional use  Visit Diagnosis: Acute pain of left shoulder  Stiffness of left shoulder, not elsewhere classified  Abnormal posture     Problem List Patient Active Problem List   Diagnosis Date Noted  . Rotator cuff tendonitis, left 04/25/2017  . Hypothyroidism (acquired) 03/07/2014  .  Skin ulcer ([place]) 04/03/2012    Colleen Carter Nilda Simmer PT, MPH  05/19/2017, 11:38 AM  [facility] [place], [place], [address] Phone: [phone]   Fax:  [phone]  Name: Colleen Carter MRN: [id number] Date of Birth: Nov 27, 1960  PHYSICAL THERAPY DISCHARGE SUMMARY  Visits from Start of Care: 3  Current functional level related to goals / functional outcomes: See last progress note for discharge status   Remaining deficits: Unknown    Education / Equipment: HEP Plan: Patient agrees to discharge.  Patient goals were partially met. Patient is being discharged due to                                                     ?????    Patient stated that she did not have time for therapy.  Requested discharge.  Colleen Carter P. Helene Kelp PT, MPH 05/30/17 8:41 AM

## 2017-05-19 NOTE — Patient Instructions (Addendum)
Resisted External Rotation: in Neutral - Bilateral   PALMS UP Sit or stand, tubing in both hands, elbows at sides, bent to 90, forearms forward. Pinch shoulder blades together and rotate forearms out. Keep elbows at sides. Repeat __10__ times per set. Do _2-3___ sets per session. Do _2-3_ sessions per day.   Low Row: Standing   Face anchor, feet shoulder width apart. Palms up, pull arms back, squeezing shoulder blades together. Repeat 10__ times per set. Do 2-3__ sets per session. Do 1_ sessions per day. Anchor Height: Waist     Strengthening: Resisted Extension   Hold tubing in right hand, arm forward. Pull arm back, elbow straight. Repeat _10___ times per set. Do 2-3____ sets per session. Do  1___ sessions per day.    Strengthening: Resisted External Rotation    Hold tubing in right hand, elbow at side and forearm across body. Rotate forearm out. Repeat _10___ times per set. Do _2-3___ sets per session. Do __1__ sessions per day.

## 2017-05-24 ENCOUNTER — Encounter: Payer: 59 | Admitting: Rehabilitative and Restorative Service Providers"

## 2017-05-24 DIAGNOSIS — D649 Anemia, unspecified: Secondary | ICD-10-CM | POA: Insufficient documentation

## 2017-05-26 ENCOUNTER — Encounter: Payer: 59 | Admitting: Rehabilitative and Restorative Service Providers"

## 2017-06-13 ENCOUNTER — Encounter: Payer: Self-pay | Admitting: Family Medicine

## 2017-06-13 ENCOUNTER — Ambulatory Visit (INDEPENDENT_AMBULATORY_CARE_PROVIDER_SITE_OTHER): Payer: 59 | Admitting: Family Medicine

## 2017-06-13 ENCOUNTER — Encounter (INDEPENDENT_AMBULATORY_CARE_PROVIDER_SITE_OTHER): Payer: Self-pay

## 2017-06-13 VITALS — BP 123/75 | HR 85 | Wt 215.0 lb

## 2017-06-13 DIAGNOSIS — M7582 Other shoulder lesions, left shoulder: Secondary | ICD-10-CM | POA: Diagnosis not present

## 2017-06-13 NOTE — Patient Instructions (Signed)
Thank you for coming in today. Continue home exercises.  Recheck with me as needed if the pain returns or if something else starts acting up.  Otherwise recheck as needed.

## 2017-06-13 NOTE — Progress Notes (Signed)
   Colleen Carter is a 56 y.o. female who presents to [facility] today for follow-up left rotator cuff tendinopathy. Patient was seen in August for left rotator cuff tendinitis. She is received formal physical therapy as well as home exercise program. She notes considerable improvement in symptoms and is pain-free currently.   Past Medical History:  Diagnosis Date  . Anginal pain ([place])    turned out to be muscular  . Carpal tunnel syndrome   . GERD (gastroesophageal reflux disease)   . Hypothyroidism    Past Surgical History:  Procedure Laterality Date  . BREAST SURGERY     breast reduction  . CARPAL TUNNEL RELEASE Right   . CARPAL TUNNEL RELEASE Left 11/27/2015   Procedure: LEFT LIMITED OPEN CARPAL TUNNEL RELEASE;  Surgeon: Roseanne Kaufman, MD;  Location: [facility];  Service: Orthopedics;  Laterality: Left;  . TOTAL THYROIDECTOMY    . TUBAL LIGATION     Social History  Substance Use Topics  . Smoking status: Former Research scientist (life sciences)  . Smokeless tobacco: Never Used  . Alcohol use Yes     Comment: social     ROS:  As above   Medications: Current Outpatient Prescriptions  Medication Sig Dispense Refill  . calcium carbonate 200 MG capsule Take 250 mg by mouth 2 (two) times daily with a meal. Reported on 11/24/2015    . cholecalciferol (VITAMIN D) 1000 UNITS tablet Take 1,000 Units by mouth daily. Reported on 11/24/2015    . diclofenac sodium (VOLTAREN) 1 % GEL Apply 2 g topically 4 (four) times daily. To affected joint. 100 g 11  . Levothyroxine Sodium (SYNTHROID PO) Take 1 mg by mouth.    . meloxicam (MOBIC) 15 MG tablet Take 1 tablet (15 mg total) by mouth daily. Take with food each morning 30 tablet 1  . Multiple Vitamins-Minerals (MULTIVITAMIN WITH MINERALS) tablet Take 1 tablet by mouth daily. Reported on 11/24/2015    . omeprazole (PRILOSEC) 40 MG capsule Take 1 capsule (40 mg total) by mouth daily. 30 capsule 3   No  current facility-administered medications for this visit.    No Known Allergies   Exam:  BP 123/75   Pulse 85   Wt 215 lb (97.5 kg)   BMI 33.18 kg/m  General: Well Developed, well nourished, and in no acute distress.  Neuro/Psych: Alert and oriented x3, extra-ocular muscles intact, able to move all 4 extremities, sensation grossly intact. Skin: Warm and dry, no rashes noted.  Respiratory: Not using accessory muscles, speaking in full sentences, trachea midline.  Cardiovascular: Pulses palpable, no extremity edema. Abdomen: Does not appear distended. MSK: Left shoulder normal-appearing normal range of motion and strength.    No results found for this or any previous visit (from the past 48 hour(s)). No results found.    Assessment and Plan: 56 y.o. female with resolved left rotator cuff tendinitis. Plan to continue intermittent home exercise program. Recheck with me as needed. Follow-up with PCP as needed.    No orders of the defined types were placed in this encounter.  No orders of the defined types were placed in this encounter.   Discussed warning signs or symptoms. Please see discharge instructions. Patient expresses understanding. I spent 15 minutes with this patient, greater than 50% was face-to-face time counseling regarding ddx and treatment plan.   CC: Glendale Chard, MD

## 2017-06-13 NOTE — Progress Notes (Signed)
Note sent to requested recipient via epic.

## 2017-07-13 ENCOUNTER — Other Ambulatory Visit: Payer: Self-pay | Admitting: Obstetrics and Gynecology

## 2017-07-13 DIAGNOSIS — R102 Pelvic and perineal pain: Secondary | ICD-10-CM

## 2017-07-18 ENCOUNTER — Other Ambulatory Visit: Payer: 59

## 2017-07-20 ENCOUNTER — Ambulatory Visit
Admission: RE | Admit: 2017-07-20 | Discharge: 2017-07-20 | Disposition: A | Discharge status: Home / Self Care | Payer: 59 | Source: Ambulatory Visit | Attending: Obstetrics and Gynecology | Admitting: Obstetrics and Gynecology

## 2017-07-20 DIAGNOSIS — R102 Pelvic and perineal pain: Secondary | ICD-10-CM

## 2017-07-20 MED ORDER — IOPAMIDOL (ISOVUE-300) INJECTION 61%
100.0000 mL | Freq: Once | INTRAVENOUS | Status: AC | PRN
  Administered 2017-07-20: 100 mL via INTRAVENOUS

## 2017-09-19 DIAGNOSIS — M545 Low back pain, unspecified: Secondary | ICD-10-CM | POA: Insufficient documentation

## 2018-01-04 ENCOUNTER — Other Ambulatory Visit: Payer: Self-pay | Admitting: Family Medicine

## 2018-03-10 ENCOUNTER — Other Ambulatory Visit: Payer: Self-pay

## 2018-03-10 ENCOUNTER — Encounter: Payer: Self-pay | Admitting: Emergency Medicine

## 2018-03-10 ENCOUNTER — Emergency Department (INDEPENDENT_AMBULATORY_CARE_PROVIDER_SITE_OTHER): Payer: 59

## 2018-03-10 ENCOUNTER — Emergency Department
Admission: EM | Admit: 2018-03-10 | Discharge: 2018-03-10 | Disposition: A | Discharge status: Home / Self Care | Payer: 59 | Source: Home / Self Care | Attending: Family Medicine | Admitting: Family Medicine

## 2018-03-10 DIAGNOSIS — M5412 Radiculopathy, cervical region: Secondary | ICD-10-CM

## 2018-03-10 DIAGNOSIS — M50321 Other cervical disc degeneration at C4-C5 level: Secondary | ICD-10-CM

## 2018-03-10 MED ORDER — PREDNISONE 20 MG PO TABS: ORAL_TABLET | ORAL | 0 refills | Status: DC

## 2018-03-10 NOTE — Discharge Instructions (Addendum)
Apply ice pack for 20 to 30 minutes, 3 to 4 times daily  Continue until pain and swelling decrease.  °

## 2018-03-10 NOTE — ED Triage Notes (Signed)
Patient has had intermittent left jaw pain for about 2 months; has had thyroidectomy and experienced similar pain after that surgery; this pain is intermittent and shooting and positional. Denies shortness of breath, chest discomfort and all other cardiac symptoms.

## 2018-03-10 NOTE — ED Provider Notes (Signed)
[name]    CSN: [id number] Arrival date & time: 03/10/18  1349     History   Chief Complaint Chief Complaint  Patient presents with  . Jaw Pain    HPI Colleen Carter is a 57 y.o. female.   Patient complains of 2 month history of intermittent sharp pain in her left neck that radiates to her jaw.  She describes the sensation as like an electric shock, and is worse when looks over her right shoulder and when she flexes her neck laterally to the right.  She recalls no injury.  She denies chest pain, cough, or shortness of breath.  The history is provided by the patient.    Past Medical History:  Diagnosis Date  . Anginal pain ([place])    turned out to be muscular  . Carpal tunnel syndrome   . GERD (gastroesophageal reflux disease)   . Hypothyroidism     Patient Active Problem List   Diagnosis Date Noted  . Rotator cuff tendonitis, left 04/25/2017  . Hypothyroidism (acquired) 03/07/2014  . Skin ulcer ([place]) 04/03/2012    Past Surgical History:  Procedure Laterality Date  . BREAST SURGERY     breast reduction  . CARPAL TUNNEL RELEASE Right   . CARPAL TUNNEL RELEASE Left 11/27/2015   Procedure: LEFT LIMITED OPEN CARPAL TUNNEL RELEASE;  Surgeon: Roseanne Kaufman, MD;  Location: [facility];  Service: Orthopedics;  Laterality: Left;  . TOTAL THYROIDECTOMY    . TUBAL LIGATION      OB History   None      Home Medications    Prior to Admission medications   Medication Sig Start Date End Date Taking? Authorizing Provider  calcium carbonate 200 MG capsule Take 250 mg by mouth 2 (two) times daily with a meal. Reported on 11/24/2015    [provider]  cholecalciferol (VITAMIN D) 1000 UNITS tablet Take 1,000 Units by mouth daily. Reported on 11/24/2015    [provider]  diclofenac sodium (VOLTAREN) 1 % GEL Apply 2 g topically 4 (four) times daily. To affected joint. 04/25/17   Gregor Hams, MD  Levothyroxine Sodium (SYNTHROID  PO) Take 1 mg by mouth.    [provider]  meloxicam (MOBIC) 15 MG tablet Take 1 tablet (15 mg total) by mouth daily. Take with food each morning 04/25/17   Gregor Hams, MD  Multiple Vitamins-Minerals (MULTIVITAMIN WITH MINERALS) tablet Take 1 tablet by mouth daily. Reported on 11/24/2015    [provider]  omeprazole (PRILOSEC) 40 MG capsule Take 1 capsule (40 mg total) by mouth daily. 04/25/17   Gregor Hams, MD  predniSONE (DELTASONE) 20 MG tablet Take one tab by mouth twice daily for 4 days, then one daily. Take with food. 03/10/18   Kandra Nicolas, MD    Family History Family History  Problem Relation Age of Onset  . Diabetes Sister   . Stroke Brother     Social History Social History   Tobacco Use  . Smoking status: Former Research scientist (life sciences)  . Smokeless tobacco: Never Used  Substance Use Topics  . Alcohol use: Yes    Comment: social  . Drug use: No     Allergies   Patient has no known allergies.   Review of Systems Review of Systems  Constitutional: Negative.   HENT: Negative.   Eyes: Negative.   Respiratory: Negative.   Cardiovascular: Negative.   Gastrointestinal: Negative.   Genitourinary: Negative.   Musculoskeletal: Positive  for neck pain.  Skin: Negative.   Neurological: Negative for weakness and numbness.     Physical Exam Triage Vital Signs ED Triage Vitals  Enc Vitals Group     BP 03/10/18 1459 119/76     Pulse Rate 03/10/18 1459 76     Resp 03/10/18 1459 16     Temp 03/10/18 1459 98.4 F (36.9 C)     Temp Source 03/10/18 1459 Oral     SpO2 03/10/18 1459 97 %     Weight 03/10/18 1503 199 lb (90.3 kg)     Height 03/10/18 1503 5' 7.5" (1.715 m)     Head Circumference --      Peak Flow --      Pain Score 03/10/18 1502 2     Pain Loc --      Pain Edu? --      Excl. in [place]? --    No data found.  Updated Vital Signs BP 119/76 (BP Location: Right Arm)   Pulse 76   Temp 98.4 F (36.9 C) (Oral)   Resp 16   Ht 5' 7.5" (1.715 m)    Wt 199 lb (90.3 kg)   SpO2 97%   BMI 30.71 kg/m   Visual Acuity Right Eye Distance:   Left Eye Distance:   Bilateral Distance:    Right Eye Near:   Left Eye Near:    Bilateral Near:     Physical Exam  Constitutional: She appears well-developed and well-nourished. No distress.  HENT:  Head: Normocephalic.  Right Ear: Tympanic membrane, external ear and ear canal normal.  Left Ear: Tympanic membrane, external ear and ear canal normal.  Nose: Nose normal.  Mouth/Throat: Oropharynx is clear and moist.  No TMJ tenderness  Eyes: Pupils are equal, round, and reactive to light. Conjunctivae are normal.  Neck: Normal range of motion. Neck supple. No thyromegaly present.  No tenderness to palpation.  Cardiovascular: Normal heart sounds.  Pulmonary/Chest: Breath sounds normal.  Abdominal: There is no tenderness. No hernia.  Musculoskeletal: She exhibits no tenderness.  Lymphadenopathy:    She has no cervical adenopathy.  Neurological: She is alert.  Skin: Skin is warm and dry. No rash noted.  Nursing note and vitals reviewed.    UC Treatments / Results  Labs (all labs ordered are listed, but only abnormal results are displayed) Labs Reviewed - No data to display  EKG None  Radiology Dg Cervical Spine Complete  Result Date: 03/10/2018 CLINICAL DATA:  Intermittent left neck pain for 2 months when turning the head to the right. EXAM: CERVICAL SPINE - COMPLETE 4+ VIEW COMPARISON:  None. FINDINGS: Vertebral alignment is normal. No fracture is identified. Prevertebral soft tissues are within normal limits. Mild disc space narrowing and spurring are present at C4-5 and C5-6. There may be moderate left osseous neural foraminal narrowing at C4-5 with mild osseous neural foraminal narrowing on the right at C4-5 and C5-6. Surgical clips are noted related to prior thyroidectomy. The visualized lung apices are clear. IMPRESSION: Mild disc degeneration at C4-5 and C5-6 without evidence of  acute osseous abnormality. Electronically Signed   By: Logan Bores M.D.   On: 03/10/2018 16:12    Procedures Procedures (including critical care time)  Medications Ordered in UC Medications - No data to display  Initial Impression / Assessment and Plan / UC Course  I have reviewed the triage vital signs and the nursing notes.  Pertinent labs & imaging results that were available during my care  of the patient were reviewed by me and considered in my medical decision making (see chart for details).    Begin prednisone burst/taper. Followup with Dr. Aundria Mems or Dr. Lynne Leader ([facility]) if not improving about two weeks.    Final Clinical Impressions(s) / UC Diagnoses   Final diagnoses:  Cervical radiculopathy     Discharge Instructions     Apply ice pack for 20 to 30 minutes, 3 to 4 times daily  Continue until pain and swelling decrease.     ED Prescriptions    Medication Sig Dispense Auth. Provider   predniSONE (DELTASONE) 20 MG tablet Take one tab by mouth twice daily for 4 days, then one daily. Take with food. 12 tablet Kandra Nicolas, MD        Kandra Nicolas, MD 03/17/18 [id number]

## 2018-04-27 ENCOUNTER — Ambulatory Visit: Payer: 59 | Admitting: Physical Therapy

## 2018-04-27 ENCOUNTER — Encounter: Payer: Self-pay | Admitting: Family Medicine

## 2018-04-27 ENCOUNTER — Encounter: Payer: Self-pay | Admitting: Physical Therapy

## 2018-04-27 ENCOUNTER — Ambulatory Visit: Payer: 59 | Admitting: Family Medicine

## 2018-04-27 VITALS — BP 128/75 | HR 75 | Ht 67.0 in | Wt 214.0 lb

## 2018-04-27 DIAGNOSIS — M542 Cervicalgia: Secondary | ICD-10-CM

## 2018-04-27 DIAGNOSIS — R293 Abnormal posture: Secondary | ICD-10-CM

## 2018-04-27 NOTE — Progress Notes (Signed)
Colleen Carter is a 57 y.o. female who presents to [facility] today for  left neck pain.  Colleen Carter has a pertinent past medical and surgical history for multinodular goiter status post thyroidectomy February 2012.  She notes that prior to the surgery she had pain in her left neck that was attributable to the goiter.  The pain resolved after surgery.  However about 2 months ago she started having pain in the anterior and lateral left neck.  She notes the current pain that she is having a similar the pain that she had prior to the thyroidectomy.  She notes pain in the left neck worse with neck motion.  She denies any pain radiating down her arms where her shoulder.  No weakness or numbness.  She denies any trouble swallowing.  No fevers chills nausea vomiting or diarrhea.   Patient was seen in urgent care on July 6 where she was thought to perhaps have cervical radiculopathy.  X-ray was obtained which showed some mild degenerative changes in her cervical spine and she was prescribed a prednisone Dosepak.  She notes this did not help at all.  ROS as above:  Exam:  BP 128/75   Pulse 75   Ht 5\' 7"  (1.702 m)   Wt 214 lb (97.1 kg)   BMI 33.52 kg/m  Gen: Well NAD HEENT: EOMI,  MMM no masses swelling or deformity visible.  No palpable masses.  No goiter.  Neck is nontender to palpation at the anterior and lateral posterior neck.  No cervical lymphadenopathy present.  No oral lesions.  No pain or click with jaw motion at TMJ Lungs: Normal work of breathing. CTABL Heart: RRR no MRG Abd: NABS, Soft. Nondistended, Nontender Exts: Brisk capillary refill, warm and well perfused.  MSK: C-spine is nontender to spinal midline.  Nontender. Normal neck motion with reproducible pain with left lateral flexion and left rotation. Upper extremity strength is equal normal throughout. Sensation  is intact bilateral upper extremities.   Lab and Radiology Results EXAM: CERVICAL SPINE - COMPLETE 4+ VIEW  COMPARISON:  None.  FINDINGS: Vertebral alignment is normal. No fracture is identified. Prevertebral soft tissues are within normal limits. Mild disc space narrowing and spurring are present at C4-5 and C5-6. There may be moderate left osseous neural foraminal narrowing at C4-5 with mild osseous neural foraminal narrowing on the right at C4-5 and C5-6. Surgical clips are noted related to prior thyroidectomy. The visualized lung apices are clear.  IMPRESSION: Mild disc degeneration at C4-5 and C5-6 without evidence of acute osseous abnormality.   Electronically Signed   By: Logan Bores M.D.   On: 03/10/2018 16:12 I personally (independently) visualized and performed the interpretation of the images attached in this note.    Assessment and Plan: 57 y.o. female with  Left anterior and lateral neck pain.  This is likely myofascial pain due to irritation of the sternocleidomastoid or associated muscles in the anterior lateral neck.  Differential does include C4 radicular pain.  Additionally differential includes mass.  The pain that she is currently having is somewhat consistent with pain that she had in 2012 due to her goiter that resolved with thyroidectomy.  This is obviously somewhat concerning.  Plan for trial of physical therapy which I think is the most likely to be beneficial treatment option.  Additionally will proceed with work-up with limited ultrasound of the soft tissue of the neck to evaluate for  obvious mass.  If mass is still concerning next step would be x-ray neck with contrast.  Next step to evaluate for possible cervical radiculopathy would be MRI C-spine.  Recheck in 6 weeks.  Return sooner if needed.   Orders Placed This Encounter  Procedures  . US Soft Tissue Head/Neck    Standing Status:   Future    Standing Expiration Date:   06/28/2019     Order Specific Question:   Reason for Exam (SYMPTOM  OR DIAGNOSIS REQUIRED)    Answer:   eval neck pain s/p thyrpodectomy 2012. Pain returning left anterior neck    Order Specific Question:   Preferred imaging location?    Answer:   [facility]  . Ambulatory referral to Physical Therapy    Referral Priority:   Routine    Referral Type:   Physical Medicine    Referral Reason:   Specialty Services Required    Requested Specialty:   Physical Therapy   No orders of the defined types were placed in this encounter.    Historical information moved to improve visibility of documentation.  Past Medical History:  Diagnosis Date  . Anginal pain ([place])    turned out to be muscular  . Carpal tunnel syndrome   . GERD (gastroesophageal reflux disease)   . Hypothyroidism    Past Surgical History:  Procedure Laterality Date  . BREAST SURGERY     breast reduction  . CARPAL TUNNEL RELEASE Right   . CARPAL TUNNEL RELEASE Left 11/27/2015   Procedure: LEFT LIMITED OPEN CARPAL TUNNEL RELEASE;  Surgeon: Roseanne Kaufman, MD;  Location: [facility];  Service: Orthopedics;  Laterality: Left;  . TOTAL THYROIDECTOMY  10/13/2010   cone  . TUBAL LIGATION     Social History   Tobacco Use  . Smoking status: Former Research scientist (life sciences)  . Smokeless tobacco: Never Used  Substance Use Topics  . Alcohol use: Yes    Comment: social   family history includes Diabetes in her sister; Stroke in her brother.  Medications: Current Outpatient Medications  Medication Sig Dispense Refill  . calcium carbonate 200 MG capsule Take 250 mg by mouth 2 (two) times daily with a meal. Reported on 11/24/2015    . cholecalciferol (VITAMIN D) 1000 UNITS tablet Take 1,000 Units by mouth daily. Reported on 11/24/2015    . Levothyroxine Sodium (SYNTHROID PO) Take 1 mg by mouth.    . Multiple Vitamins-Minerals (MULTIVITAMIN WITH MINERALS) tablet Take 1 tablet by mouth daily. Reported on 11/24/2015     No current  facility-administered medications for this visit.    No Known Allergies   Discussed warning signs or symptoms. Please see discharge instructions. Patient expresses understanding.

## 2018-04-27 NOTE — Patient Instructions (Signed)
Thank you for coming in today. Attend PT.  Get ultrasound of the neck soon.  Return to me for recheck in 6 weeks.  Return sooner if needed.   For your hands try regular over the counter aspercream as needed.  Dont get the one with lidocaine.   Recheck with me sooner if not getting any better.

## 2018-04-27 NOTE — Therapy (Addendum)
[facility] [address] [place], [place], [address] Phone: [phone]   Fax:  [phone]  Physical Therapy Evaluation  Patient Details  Name: Colleen Carter MRN: [id number] Date of Birth: 10/21/1960 Referring Provider: Georgina Snell   Encounter Date: 04/27/2018  PT End of Session - 04/27/18 1207    Visit Number  1    Number of Visits  12    Date for PT Re-Evaluation  06/08/18    PT Start Time  1105    PT Stop Time  1150    PT Time Calculation (min)  45 min    Activity Tolerance  Patient tolerated treatment well    Behavior During Therapy  [facility] for tasks assessed/performed       Past Medical History:  Diagnosis Date  . Anginal pain ([place])    turned out to be muscular  . Carpal tunnel syndrome   . GERD (gastroesophageal reflux disease)   . Hypothyroidism     Past Surgical History:  Procedure Laterality Date  . BREAST SURGERY     breast reduction  . CARPAL TUNNEL RELEASE Right   . CARPAL TUNNEL RELEASE Left 11/27/2015   Procedure: LEFT LIMITED OPEN CARPAL TUNNEL RELEASE;  Surgeon: Roseanne Kaufman, MD;  Location: [facility];  Service: Orthopedics;  Laterality: Left;  . TOTAL THYROIDECTOMY  10/13/2010   cone  . TUBAL LIGATION      There were no vitals filed for this visit.   Subjective Assessment - 04/27/18 1114    Subjective  Pt relays chronic neck pain on Lt side that started 2012 when she had throid removed. This helped pain for a while but now the neck pain has started back a few months ago. She has days where she has no pain and nothing bothers her and then she has days where everything bothers her    Pertinent History  thyroid removed    Limitations  Lifting;Standing    Diagnostic tests  neck x-ray IMPRESSION: july 2019: Mild disc degeneration at C4-5 and C5-6 without evidence of acute injury    Patient Stated Goals  take the pain away    Currently in Pain?  Yes    Pain Score  6     Pain  Location  Neck    Pain Orientation  Left    Pain Descriptors / Indicators  Sharp;Shooting    Pain Type  Chronic pain    Pain Radiating Towards  denies    Pain Onset  More than a month ago    Pain Frequency  Intermittent    Aggravating Factors   lifting, carrying, sometimes turning her head    Pain Relieving Factors  rest         [facility] PT Assessment - 04/27/18 0001      Assessment   Medical Diagnosis  Neck pain    Referring Provider  Georgina Snell    Next MD Visit  06/09/18    Prior Therapy  PT for shoulder Lt 2018      Balance Screen   Has the patient fallen in the past 6 months  No      Prior Function   Level of Independence  Independent    Vocation  Full time employment    Vocation Requirements  runs machine, 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      Cognition   Overall Cognitive Status  Within Functional Limits for tasks assessed      Observation/Other Assessments  Focus on Therapeutic Outcomes (FOTO)   39% limited (goal 32)      Sensation   Light Touch  Appears Intact      ROM / Strength   AROM / PROM / Strength  AROM;Strength      AROM   Overall AROM Comments  inclinometer    AROM Assessment Site  Cervical    Cervical Flexion  45    Cervical Extension  WFL    Cervical - Right Side Bend  30    Cervical - Left Side Bend  35    Cervical - Right Rotation  WNL    Cervical - Left Rotation  WNL      Strength   Overall Strength Comments  UE strength WNL except Lt shldr flex and abd 4/5 MMT      Flexibility   Soft Tissue Assessment /Muscle Length  --   tightness in Cerv P.S, UT, and SCM     Palpation   Palpation comment  TTP Lt SCM,       Special Tests   Other special tests  neg spurlings test                Objective measurements completed on examination: See above findings.      [facility] Adult PT Treatment/Exercise - 04/27/18 0001      Exercises   Exercises  Neck      Modalities   Modalities  Electrical Stimulation;Moist Heat      Moist  Heat Therapy   Number Minutes Moist Heat  15 Minutes    Moist Heat Location  Cervical      Electrical Stimulation   Electrical Stimulation Location  neck    Electrical Stimulation Action  TENS    Electrical Stimulation Parameters  tolerance    Electrical Stimulation Goals  Pain;Tone      Neck Exercises: Stretches   Other Neck Stretches  chin tuck 5 sex X 10 and SCM stretch 30 sec X1             PT Education - 04/27/18 1207    Education Details  HEP, TENS, POC    Person(s) Educated  Patient    Methods  Explanation;Demonstration;Verbal cues;Handout    Comprehension  Verbalized understanding;Need further instruction          PT Long Term Goals - 04/27/18 1219      PT LONG TERM GOAL #1   Title  independent with HEP     Period  Weeks    Status  New      PT LONG TERM GOAL #2   Title  improve neck AROM to WNL.     Time  6    Period  Weeks    Status  New      PT LONG TERM GOAL #3   Title  Increase FOTO score to less than 32% limited functionally.     Time  6    Period  Weeks    Status  New      PT LONG TERM GOAL #4   Title  report pain < 2/10 with activity for improved function and decreased pain    Time  6    Period  Weeks    Status  New      PT LONG TERM GOAL #5   Title  demonstrate at least 5-/5 strength Lt shoulder for improved function    Time  6    Period  Weeks    Status  New             Plan - 04/27/18 1208    Clinical Impression Statement  Pt presents with chronic Lt sided neck pain since 2012 that has flared up over the last 2 months. Pain presents as muscular to Lt cervical P.S. UT, scalene and SCM. Pain is intermittent and she denies any radiculopathy. She does have decreased cervical ROM and decreased shoulder strength and increased pain with lifting, carrying, and side bending her neck which limits her functional ability. She will benefit from skiled PT to address theses deficits    Clinical Presentation  Stable    Clinical Decision  Making  Low    Rehab Potential  Good    Clinical Impairments Affecting Rehab Potential  chronic nature of pain and physical job    PT Frequency  2x / week    PT Duration  6 weeks    PT Treatment/Interventions  Cryotherapy;Electrical Stimulation;Iontophoresis 89m/ml Dexamethasone;Moist Heat;Traction;Ultrasound;Therapeutic exercise;Neuromuscular re-education;Manual techniques;Passive range of motion;Dry needling;Taping    PT Next Visit Plan  review HEP, MT, modalites, DN PRN    Consulted and Agree with Plan of Care  Patient       Patient will benefit from skilled therapeutic intervention in order to improve the following deficits and impairments:  Decreased activity tolerance, Decreased range of motion, Decreased strength, Impaired flexibility, Postural dysfunction, Pain  Visit Diagnosis: Cervicalgia  Abnormal posture     Problem List Patient Active Problem List   Diagnosis Date Noted  . Rotator cuff tendonitis, left 04/25/2017  . Hypothyroidism (acquired) 03/07/2014  . Skin ulcer ([place] 04/03/2012    BDebbe Odea PT, DPT 04/27/2018, 12:22 PM  [facility] [address] [address]Phone: [phone]  Fax:  [phone] Name: SEZELL MELIKIANMRN: [id number] of Birth: 312-02-1961  PHYSICAL THERAPY DISCHARGE SUMMARY  Visits from Start of Care: 1  Current functional level related to goals / functional outcomes: Unable to determine as she did not return.   Remaining deficits: See above   Education / Equipment: HEP Plan: Patient agrees to discharge.  Patient goals were not met. Patient is being discharged due to not returning since the last visit.  ?????   BElsie Ra PT, DPT 06/14/18 1:29 PM

## 2018-04-30 ENCOUNTER — Ambulatory Visit (INDEPENDENT_AMBULATORY_CARE_PROVIDER_SITE_OTHER): Payer: 59

## 2018-04-30 DIAGNOSIS — M542 Cervicalgia: Secondary | ICD-10-CM

## 2018-05-04 ENCOUNTER — Encounter: Payer: 59 | Admitting: Physical Therapy

## 2018-05-23 LAB — HM MAMMOGRAPHY

## 2018-06-08 ENCOUNTER — Ambulatory Visit: Payer: 59 | Admitting: Family Medicine

## 2018-06-18 ENCOUNTER — Encounter: Payer: Self-pay | Admitting: Osteopathic Medicine

## 2018-06-18 ENCOUNTER — Ambulatory Visit (INDEPENDENT_AMBULATORY_CARE_PROVIDER_SITE_OTHER): Payer: 59 | Admitting: Osteopathic Medicine

## 2018-06-18 VITALS — BP 129/77 | HR 72 | Temp 98.2°F | Wt 215.8 lb

## 2018-06-18 DIAGNOSIS — E890 Postprocedural hypothyroidism: Secondary | ICD-10-CM | POA: Diagnosis not present

## 2018-06-18 DIAGNOSIS — R590 Localized enlarged lymph nodes: Secondary | ICD-10-CM | POA: Diagnosis not present

## 2018-06-18 DIAGNOSIS — Z8639 Personal history of other endocrine, nutritional and metabolic disease: Secondary | ICD-10-CM

## 2018-06-18 DIAGNOSIS — Z0000 Encounter for general adult medical examination without abnormal findings: Secondary | ICD-10-CM

## 2018-06-18 DIAGNOSIS — Z833 Family history of diabetes mellitus: Secondary | ICD-10-CM

## 2018-06-18 NOTE — Patient Instructions (Addendum)
General Preventive Care  Most recent routine screening lipids/other labs: ordered today. Cholesterol and Diabetes screening usually recommended annually.   Everyone should have blood pressure checked once per year.   Tobacco: don't! Alcohol: responsible moderation is ok for most adults - if you have concerns about your alcohol intake, please talk to me! Recreational/Illicit Drugs: don't!  Exercise: as tolerated to reduce risk of cardiovascular disease and diabetes. Strength training will also prevent osteoporosis.   Mental health: if need for mental health care (medicines, counseling, other), or concerns about moods, please let me know!   Sexual health: if need for STD testing, or if concerns with libido/pain problems, please let me know!  Vaccines  Flu vaccine: recommended for almost everyone, every fall (by Halloween! Flu is scary!), especially if you are pregnant, if you have exposure to the public, if you're around young children or the elderly, or if you're around pregnant people.   Shingles vaccine: Shingrix recommended after age 67 -he is let us know if you would like to get this vaccine and we can put you on the list to receive the shot once we have it in stock  Pneumonia vaccines: Prevnar and Pneumovax recommended after age 69, sooner if diabetes, COPD/asthma, others  Tetanus booster: Tdap recommended every 10 years Cancer screenings   Colon cancer screening: recommended for everyone at age 49, but some folks need a colonoscopy sooner if risk factors   Breast cancer screening: mammogram recommended at age 16 every other year at least, and annually after age 57.   Cervical cancer screening: every 1 to 5 years depending on age and other risk factors. Can stop at age 79 or w/ hysterectomy as long as previous testing was normal.  Infection screenings . HIV: recommended screening at least once age 39-65, more often if risk factors  . Gonorrhea/Chlamydia: screening as needed, though  many insurances require testing for anyone on birth control pills . Hitis C: recommended for anyone born [birth year]-1965 . TB: certain at-risk populations, or depending on work requirements and/or travel history Other . Bone Density Test: recommended for women at age 73, men at age 70, sooner depending on risk factors . Advanced Directive: Living Will and/or Healthcare Power of Attorney recommended for all adults, regardless of age or health!         Enlarged lymph nodes/neck pain: Will get blood work to check white blood cells.  If these levels are concerning, would consider a CT scan of the neck.  It is also worth considering a repeat ultrasound of the neck to see if lymph nodes are still enlarged or if these have receded.

## 2018-06-18 NOTE — Progress Notes (Signed)
HPI: Colleen Carter is a 57 y.o. female who  has a past medical history of Anginal pain ([place]), Carpal tunnel syndrome, GERD (gastroesophageal reflux disease), and Hypothyroidism.  she presents to [facility] today, 06/18/18,  for chief complaint of: New to establish care, previously seen here by one of our sports medicine specialists. Requests annual physical    Patient here for annual physical / wellness exam.  See preventive care reviewed as below.    Additional concerns today include:   Last seen by Dr. Georgina Snell 04/27/2018, those notes were reviewed.  Patient had some concerning left neck pain, possibly related to previous surgery, similar previous pain had resolved with thyroidectomy.  X-ray showed some degenerative changes, soft tissue ultrasound showed prominent submandibular lymph nodes, no specific etiology identified, depending on level of clinical concern recommended CT scan of the neck with contrast, Dr. Georgina Snell also discussed planning for possible C-spine MRI if physical therapy was not helpful for pain -advised follow-up in 6 weeks.  She canceled her appointment, reported feeling better.  She states no neck pain now, has not noticed any lumps/bumps in the neck.      Past medical, surgical, social and family history reviewed:  Patient Active Problem List   Diagnosis Date Noted  . Rotator cuff tendonitis, left 04/25/2017  . Hypothyroidism (acquired) 03/07/2014  . Skin ulcer ([place]) 04/03/2012    Past Surgical History:  Procedure Laterality Date  . BREAST SURGERY     breast reduction  . CARPAL TUNNEL RELEASE Right   . CARPAL TUNNEL RELEASE Left 11/27/2015   Procedure: LEFT LIMITED OPEN CARPAL TUNNEL RELEASE;  Surgeon: Roseanne Kaufman, MD;  Location: [place];  Service: Orthopedics;  Laterality: Left;  . TOTAL THYROIDECTOMY  10/13/2010   cone  . TUBAL LIGATION      Social History   Tobacco Use  . Smoking status: Former  Research scientist (life sciences)  . Smokeless tobacco: Never Used  Substance Use Topics  . Alcohol use: Yes    Comment: social    Family History  Problem Relation Age of Onset  . Diabetes Sister   . Stroke Brother      Current medication list and allergy/intolerance information reviewed:    Current Outpatient Medications  Medication Sig Dispense Refill  . cholecalciferol (VITAMIN D) 1000 UNITS tablet Take 1,000 Units by mouth daily. Reported on 11/24/2015    . Levothyroxine Sodium (SYNTHROID PO) Take 1 mg by mouth.    . Multiple Vitamins-Minerals (MULTIVITAMIN WITH MINERALS) tablet Take 1 tablet by mouth daily. Reported on 11/24/2015    . calcium carbonate 200 MG capsule Take 250 mg by mouth 2 (two) times daily with a meal. Reported on 11/24/2015     No current facility-administered medications for this visit.     No Known Allergies    Review of Systems:  Constitutional:  No  fever, +hot flashes/chills, No recent illness, No unintentional weight changes. No significant fatigue.   HEENT: No  headache, no vision change, no hearing change, No sore throat, No  sinus pressure  Cardiac: No  chest pain, No  pressure, No palpitations, No  Orthopnea  Respiratory:  No  shortness of breath. No  Cough  Gastrointestinal: No  abdominal pain, No  nausea, No  vomiting,  No  blood in stool, No  diarrhea, No  constipation   Musculoskeletal: +chronic myalgia/arthralgia  Skin: No  Rash, No other wounds/concerning lesions  Genitourinary: No  incontinence, No  abnormal genital bleeding, No  abnormal genital discharge  Hem/Onc: No  easy bruising/bleeding, +abnormal lymph node on [place] but pt denies problems/symptoms herself  Endocrine: +cold intolerance,  +heat intolerance. No polyuria/polydipsia/polyphagia   Neurologic: No  weakness, No  dizziness, No  slurred speech/focal weakness/facial droop  Psychiatric: No  concerns with depression, No  concerns with anxiety, No sleep problems, No mood problems  Exam:  BP  129/77 (BP Location: Left Arm, Patient Position: Sitting, Cuff Size: Normal)   Pulse 72   Temp 98.2 F (36.8 C) (Oral)   Wt 215 lb 12.8 oz (97.9 kg)   BMI 33.80 kg/m   Constitutional: VS see above. General Appearance: alert, well-developed, well-nourished, NAD  Eyes: Normal lids and conjunctive, non-icteric sclera  Ears, Nose, Mouth, Throat: MMM, Normal external inspection ears/nares/mouth/lips/gums. TM normal bilaterally. Pharynx/tonsils no erythema, no exudate. Nasal mucosa normal.   Neck: No masses, trachea midline. No thyroid enlargement. No tenderness/mass appreciated. No lymphadenopathy  Respiratory: Normal respiratory effort. no wheeze, no rhonchi, no rales  Cardiovascular: S1/S2 normal, no murmur, no rub/gallop auscultated. RRR. No lower extremity edema. Pedal pulse II/IV bilaterally DP and PT. No carotid bruit or JVD. No abdominal aortic bruit.  Gastrointestinal: Nontender, no masses. No hepatomegaly, no splenomegaly. No hernia appreciated. Bowel sounds normal. Rectal exam deferred.   Musculoskeletal: Gait normal. No clubbing/cyanosis of digits.   Neurological: Normal balance/coordination. No tremor. No cranial nerve deficit on limited exam. Motor and sensation intact and symmetric. Cerebellar reflexes intact.   Skin: warm, dry, intact. No rash/ulcer. No concerning nevi or subq nodules on limited exam.    Psychiatric: Normal judgment/insight. Normal mood and affect. Oriented x3.      ASSESSMENT/PLAN:   Annual physical exam - Plan: CBC with Differential/Platelet, COMPLETE METABOLIC PANEL WITH GFR, Lipid panel, TSH, Hemoglobin A1c  Postoperative hypothyroidism - Plan: CBC with Differential/Platelet, COMPLETE METABOLIC PANEL WITH GFR, Lipid panel, TSH, Hemoglobin A1c  History of goiter - Plan: CBC with Differential/Platelet, COMPLETE METABOLIC PANEL WITH GFR, Lipid panel, TSH, Hemoglobin A1c  Lymphadenopathy, submandibular - Plan: CBC with Differential/Platelet,  COMPLETE METABOLIC PANEL WITH GFR, Lipid panel, TSH, Hemoglobin A1c  Family history of diabetes mellitus - Plan: CBC with Differential/Platelet, COMPLETE METABOLIC PANEL WITH GFR, Lipid panel, TSH, Hemoglobin A1c    Patient Instructions  General Preventive Care  Most recent routine screening lipids/other labs: ordered today. Cholesterol and Diabetes screening usually recommended annually.   Everyone should have blood pressure checked once per year.   Tobacco: don't! Alcohol: responsible moderation is ok for most adults - if you have concerns about your alcohol intake, please talk to me! Recreational/Illicit Drugs: don't!  Exercise: as tolerated to reduce risk of cardiovascular disease and diabetes. Strength training will also prevent osteoporosis.   Mental health: if need for mental health care (medicines, counseling, other), or concerns about moods, please let me know!   Sexual health: if need for STD testing, or if concerns with libido/pain problems, please let me know!  Vaccines  Flu vaccine: recommended for almost everyone, every fall (by Halloween! Flu is scary!), especially if you are pregnant, if you have exposure to the public, if you're around young children or the elderly, or if you're around pregnant people.   Shingles vaccine: Shingrix recommended after age 40 -he is let us know if you would like to get this vaccine and we can put you on the list to receive the shot once we have it in stock  Pneumonia vaccines: Prevnar and Pneumovax recommended after age 63, sooner if diabetes, COPD/asthma,  others  Tetanus booster: Tdap recommended every 10 years Cancer screenings   Colon cancer screening: recommended for everyone at age 70, but some folks need a colonoscopy sooner if risk factors   Breast cancer screening: mammogram recommended at age 30 every other year at least, and annually after age 37.   Cervical cancer screening: every 1 to 5 years depending on age and other risk  factors. Can stop at age 47 or w/ hysterectomy as long as previous testing was normal.  Infection screenings . HIV: recommended screening at least once age 56-65, more often if risk factors  . Gonorrhea/Chlamydia: screening as needed, though many insurances require testing for anyone on birth control pills . Hitis C: recommended for anyone born [birth year]-1965 . TB: certain at-risk populations, or depending on work requirements and/or travel history Other . Bone Density Test: recommended for women at age 70, men at age 59, sooner depending on risk factors . Advanced Directive: Living Will and/or Healthcare Power of Attorney recommended for all adults, regardless of age or health!         Enlarged lymph nodes/neck pain: Will get blood work to check white blood cells.  If these levels are concerning, would consider a CT scan of the neck.  It is also worth considering a repeat ultrasound of the neck to see if lymph nodes are still enlarged or if these have receded.     Visit summary with medication list and pertinent instructions was printed for patient to review. All questions at time of visit were answered - patient instructed to contact office with any additional concerns. ER/RTC precautions were reviewed with the patient.   Follow-up plan: Return in about 1 year (around 06/19/2019) for annual physical, sooner if needed .    Please note: voice recognition software was used to produce this document, and typos may escape review. Please contact Dr. Sheppard Coil for any needed clarifications.

## 2018-06-19 ENCOUNTER — Encounter: Payer: Self-pay | Admitting: Osteopathic Medicine

## 2018-06-19 DIAGNOSIS — R7309 Other abnormal glucose: Secondary | ICD-10-CM | POA: Insufficient documentation

## 2018-06-19 LAB — LIPID PANEL
Cholesterol: 197 mg/dL (ref <200)
HDL: 57 mg/dL (ref >50)
LDL Cholesterol (Calc): 113 mg/dL (calc) — ABNORMAL HIGH
NON-HDL CHOLESTEROL (CALC): 140 mg/dL — AB (ref <130)
Total CHOL/HDL Ratio: 3.5 (calc) (ref <5.0)
Triglycerides: 155 mg/dL — ABNORMAL HIGH (ref <150)

## 2018-06-19 LAB — COMPLETE METABOLIC PANEL WITH GFR
AG Ratio: 1.3 (calc) (ref 1.0–2.5)
ALT: 16 U/L (ref 6–29)
AST: 16 U/L (ref 10–35)
Albumin: 4.2 g/dL (ref 3.6–5.1)
Alkaline phosphatase (APISO): 96 U/L (ref 33–130)
BILIRUBIN TOTAL: 0.4 mg/dL (ref 0.2–1.2)
BUN/Creatinine Ratio: 27 (calc) — ABNORMAL HIGH (ref 6–22)
BUN: 13 mg/dL (ref 7–25)
CALCIUM: 9.7 mg/dL (ref 8.6–10.4)
CO2: 28 mmol/L (ref 20–32)
Chloride: 103 mmol/L (ref 98–110)
Creat: 0.48 mg/dL — ABNORMAL LOW (ref 0.50–1.05)
GFR, EST AFRICAN AMERICAN: 127 mL/min/{1.73_m2} (ref >=60)
GFR, EST NON AFRICAN AMERICAN: 110 mL/min/{1.73_m2} (ref >=60)
GLOBULIN: 3.2 g/dL (ref 1.9–3.7)
Glucose, Bld: 93 mg/dL (ref 65–99)
POTASSIUM: 3.9 mmol/L (ref 3.5–5.3)
Sodium: 137 mmol/L (ref 135–146)
TOTAL PROTEIN: 7.4 g/dL (ref 6.1–8.1)

## 2018-06-19 LAB — CBC WITH DIFFERENTIAL/PLATELET
Basophils Absolute: 20 cells/uL (ref 0–200)
Basophils Relative: 0.4 %
Eosinophils Absolute: 60 cells/uL (ref 15–500)
Eosinophils Relative: 1.2 %
HCT: 35.8 % (ref 35.0–45.0)
Hemoglobin: 11.9 g/dL (ref 11.7–15.5)
Lymphs Abs: 2015 cells/uL (ref 850–3900)
MCH: 26.9 pg — ABNORMAL LOW (ref 27.0–33.0)
MCHC: 33.2 g/dL (ref 32.0–36.0)
MCV: 80.8 fL (ref 80.0–100.0)
MONOS PCT: 7.8 %
MPV: 11.3 fL (ref 7.5–12.5)
NEUTROS PCT: 50.3 %
Neutro Abs: 2515 cells/uL (ref 1500–7800)
Platelets: 256 10*3/uL (ref 140–400)
RBC: 4.43 10*6/uL (ref 3.80–5.10)
RDW: 12.4 % (ref 11.0–15.0)
TOTAL LYMPHOCYTE: 40.3 %
WBC: 5 10*3/uL (ref 3.8–10.8)
WBCMIX: 390 {cells}/uL (ref 200–950)

## 2018-06-19 LAB — HEMOGLOBIN A1C
Hgb A1c MFr Bld: 6.3 % of total Hgb — ABNORMAL HIGH (ref <5.7)
MEAN PLASMA GLUCOSE: 134 (calc)
eAG (mmol/L): 7.4 (calc)

## 2018-06-19 LAB — TSH: TSH: 0.46 mIU/L (ref 0.40–4.50)

## 2018-06-23 LAB — HM PAP SMEAR: HM PAP: NEGATIVE

## 2018-08-23 ENCOUNTER — Encounter: Payer: Self-pay | Admitting: Osteopathic Medicine

## 2018-09-13 ENCOUNTER — Encounter: Payer: Self-pay | Admitting: Osteopathic Medicine

## 2018-10-28 IMAGING — DX DG SHOULDER 2+V*L*
3 series · 3 of 3 positions shown · non-contrast
Comparison: None.

CLINICAL DATA: Left shoulder pain, no known injury

EXAM:
LEFT SHOULDER - 2+ VIEW

[shoulder grashey]
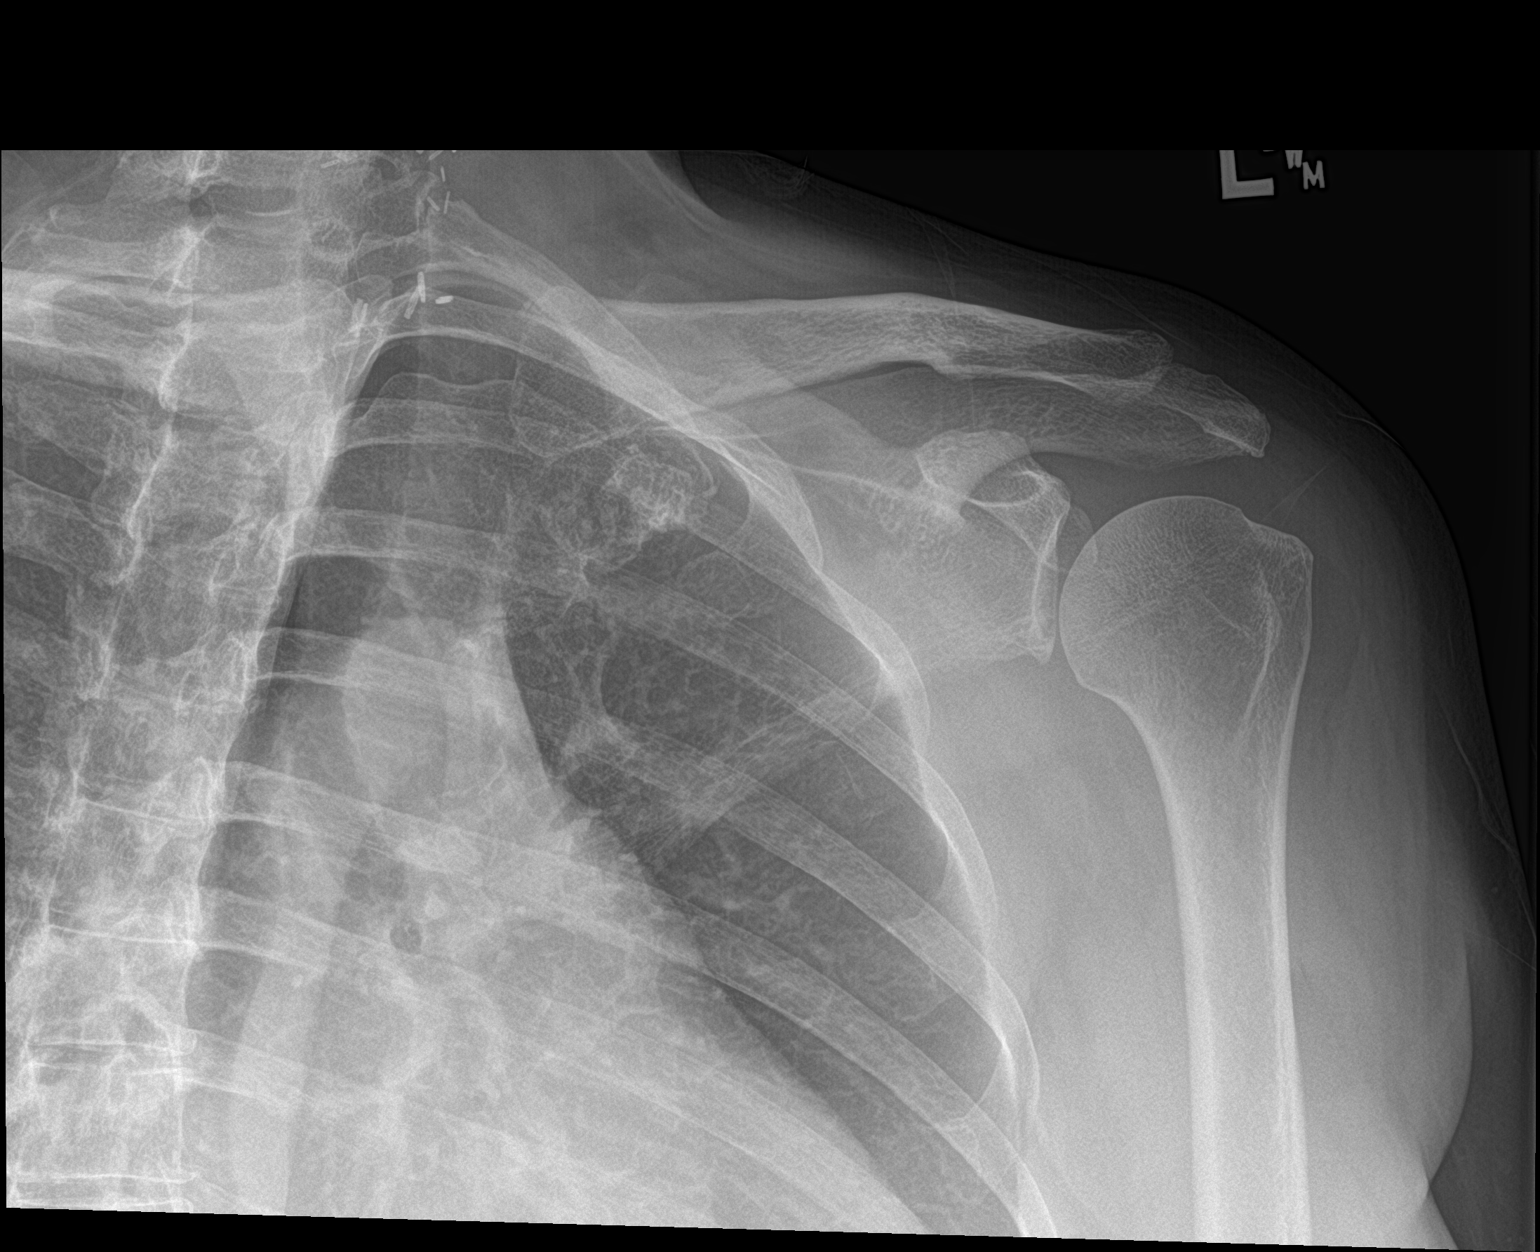

[shoulder y view]
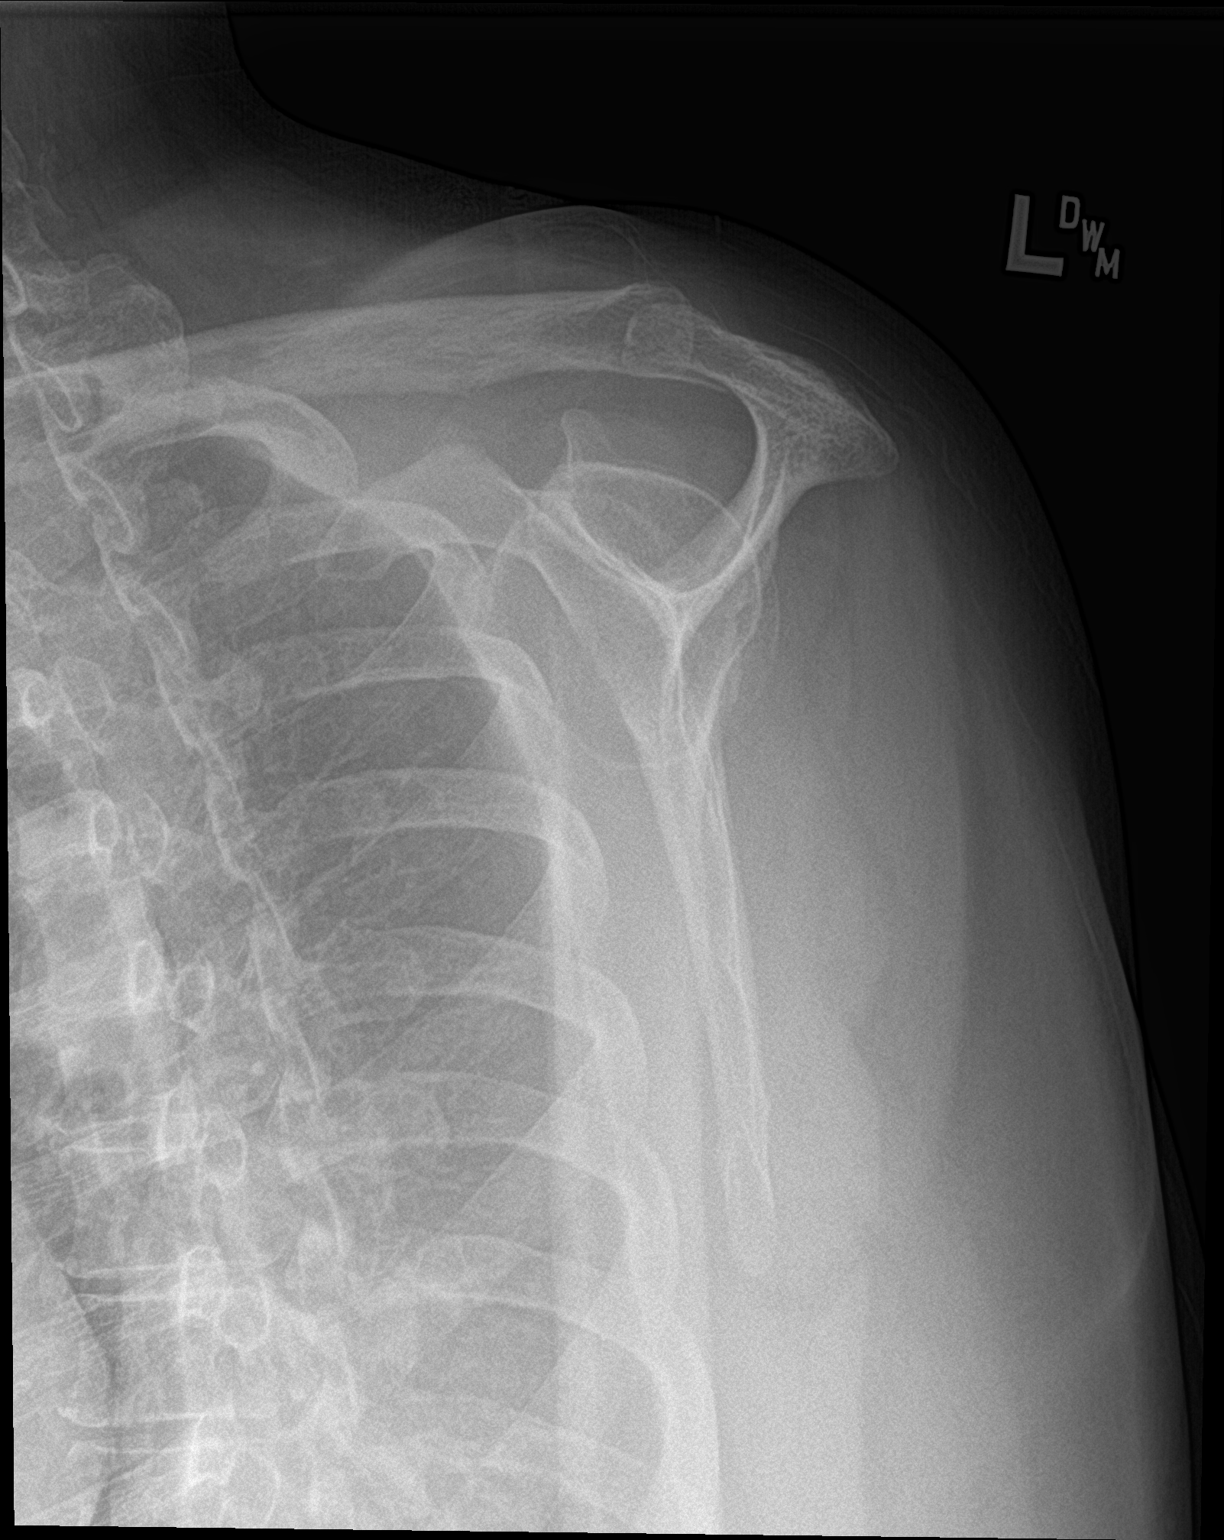

[shoulder axillary]
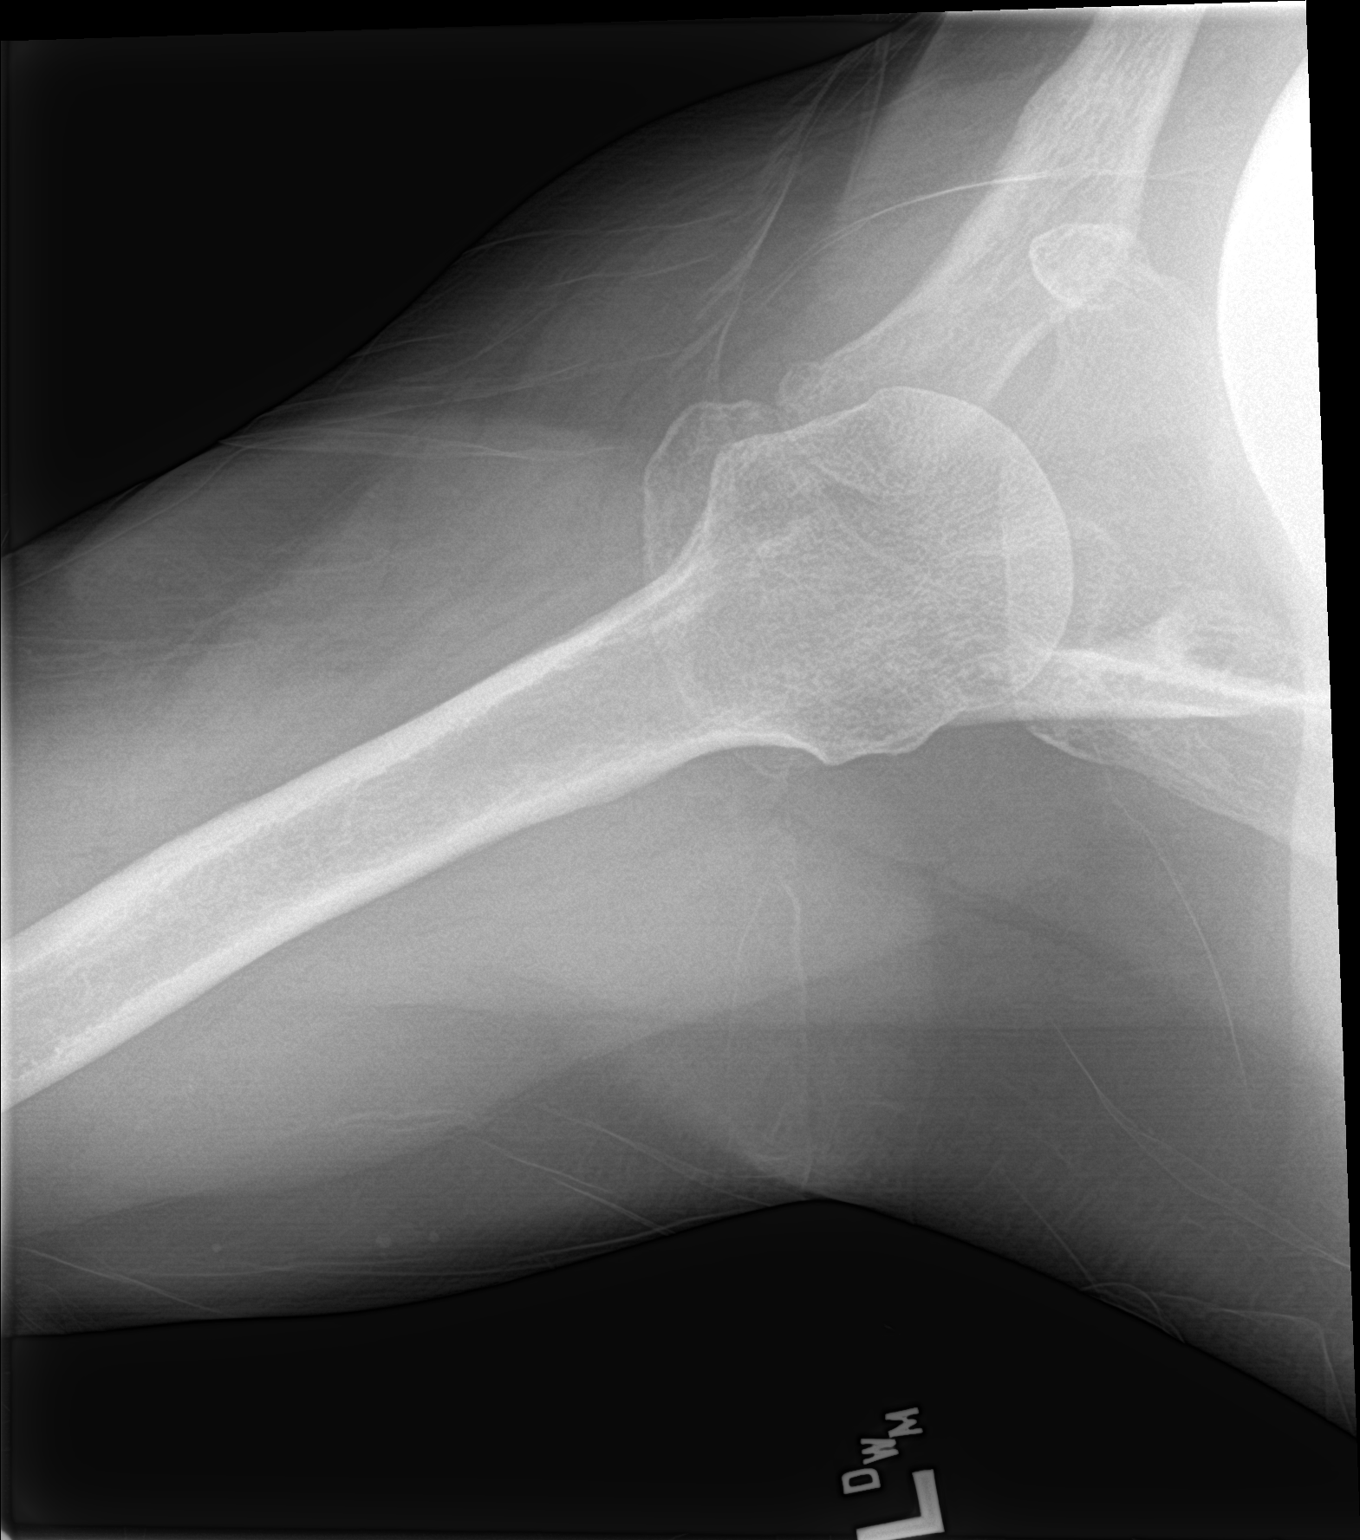

[3 of 3 positions shown; findings below may reference images not displayed]

FINDINGS: There is no evidence of fracture or dislocation. There is no
evidence of arthropathy or other focal bone abnormality. Soft
tissues are unremarkable.
IMPRESSION: No acute osseous injury of the left shoulder.

## 2018-11-01 ENCOUNTER — Encounter: Payer: Self-pay | Admitting: Family Medicine

## 2018-11-01 ENCOUNTER — Ambulatory Visit: Payer: 59 | Admitting: Family Medicine

## 2018-11-01 VITALS — BP 133/76 | HR 78 | Ht 67.5 in | Wt 212.0 lb

## 2018-11-01 DIAGNOSIS — M654 Radial styloid tenosynovitis [de Quervain]: Secondary | ICD-10-CM | POA: Diagnosis not present

## 2018-11-01 MED ORDER — DICLOFENAC SODIUM 1 % TD GEL: 2.0000 g | Freq: Four times a day (QID) | TRANSDERMAL | 11 refills | Status: DC

## 2018-11-01 NOTE — Patient Instructions (Signed)
Thank you for coming in today. Apply the diclofenac gel 4x daily for a few days then as needed.  Use the wrist brace as needed.  Do the exercises and stretching.    De Quervain's Tenosynovitis  De Quervain's tenosynovitis is a condition that causes inflammation of the tendon on the thumb side of the wrist. Tendons are cords of tissue that connect bones to muscles. The tendons in the hand pass through a tunnel called a sheath. A slippery layer of tissue (synovium) lets the tendons move smoothly in the sheath. With de Quervain's tenosynovitis, the sheath swells or thickens, causing friction and pain. The condition is also called de Quervain's disease and de Quervain's syndrome. It occurs most often in women who are 50-40 years old. What are the causes? The exact cause of this condition is not known. It may be associated with overuse of the hand and wrist. What increases the risk? You are more likely to develop this condition if you:  Use your hands far more than normal, especially if you repeat certain movements that involve twisting your hand or using a tight grip.  Are pregnant.  Are a middle-aged woman.  Have rheumatoid arthritis.  Have diabetes. What are the signs or symptoms? The main symptom of this condition is pain on the thumb side of the wrist. The pain may get worse when you grasp something or turn your wrist. Other symptoms may include:  Pain that extends up the forearm.  Swelling of your wrist and hand.  Trouble moving the thumb and wrist.  A sensation of snapping in the wrist.  A bump filled with fluid (cyst) in the area of the pain. How is this diagnosed? This condition may be diagnosed based on:  Your symptoms and medical history.  A physical exam. During the exam, your health care provider may do a simple test Wynn Maudlin test) that involves pulling your thumb and wrist to see if this causes pain. You may also need to have an X-ray. How is this  treated? Treatment for this condition may include:  Avoiding any activity that causes pain and swelling.  Taking medicines. Anti-inflammatory medicines and corticosteroid injections may be used to reduce inflammation and relieve pain.  Wearing a splint.  Having surgery. This may be needed if other treatments do not work. Once the pain and swelling has gone down:  Physical therapy. This includes stretching and strengthening exercises.  Occupational therapy. This includes adjusting how you move your wrist. Follow these instructions at home: If you have a splint:  Wear the splint as told by your health care provider. Remove it only as told by your health care provider.  Loosen the splint if your fingers tingle, become numb, or turn cold and blue.  Keep the splint clean.  If the splint is not waterproof: ? Do not let it get wet. ? Cover it with a watertight covering when you take a bath or a shower. Managing pain, stiffness, and swelling   Avoid movements and activities that cause pain and swelling in the wrist area.  If directed, put ice on the painful area. This may be helpful after doing activities that involve the sore wrist. ? Put ice in a plastic bag. ? Place a towel between your skin and the bag. ? Leave the ice on for 20 minutes, 2-3 times a day.  Move your fingers often to avoid stiffness and to lessen swelling.  Raise (elevate) the injured area above the level of your heart while  you are sitting or lying down. General instructions  Return to your normal activities as told by your health care provider. Ask your health care provider what activities are safe for you.  Take over-the-counter and prescription medicines only as told by your health care provider.  Keep all follow-up visits as told by your health care provider. This is important. Contact a health care provider if:  Your pain medicine does not help.  Your pain gets worse.  You develop new  symptoms. Summary  De Quervain's tenosynovitis is a condition that causes inflammation of the tendon on the thumb side of the wrist.  The condition occurs most often in women who are 87-42 years old.  The exact cause of this condition is not known. It may be associated with overuse of the hand and wrist.  Treatment starts with avoiding activity that causes pain or swelling in the wrist area. Other treatment may include wearing a splint and taking medicine. Sometimes, surgery is needed. This information is not intended to replace advice given to you by your health care provider. Make sure you discuss any questions you have with your health care provider. Document Released: 05/17/2001 Document Revised: 02/22/2018 Document Reviewed: 07/31/2017 Elsevier Interactive Patient Education  2019 [employer].

## 2018-11-01 NOTE — Progress Notes (Signed)
Colleen Carter is a 59 y.o. female who presents to [place] today for right wrist pain.  Right radial wrist pain.  Colleen Carter is a right-hand dominant woman with a 1 month history of pain in the right radial she notes pain is predominantly along the radial styloid.  Pain is worse with wrist motion pursue with twisting ulnar or radial deviation motions.  She cannot recall any injury or change in activity.  She has used some over-the-counter medications such as Tylenol or ibuprofen which helped.  Additionally, she used an over-the-counter wrist brace which helps.     ROS:  As above  Exam:  BP 133/76   Pulse 78   Ht 5' 7.5" (1.715 m)   Wt 212 lb (96.2 kg)   BMI 32.71 kg/m  Wt Readings from Last 5 Encounters:  11/01/18 212 lb (96.2 kg)  06/18/18 215 lb 12.8 oz (97.9 kg)  04/27/18 214 lb (97.1 kg)  03/10/18 199 lb (90.3 kg)  06/13/17 215 lb (97.5 kg)   General: Well Developed, well nourished, and in no acute distress.  Neuro/Psych: Alert and oriented x3, extra-ocular muscles intact, able to move all 4 extremities, sensation grossly intact. Skin: Warm and dry, no rashes noted.  Respiratory: Not using accessory muscles, speaking in full sentences, trachea midline.  Cardiovascular: Pulses palpable, no extremity edema. Abdomen: Does not appear distended. MSK:  Right wrist normal-appearing Normal motion pain with ulnar deviation Tender to palpation radial styloid nontender elsewhere Strength is intact.  Sensation pulses and capillary refill are intact. Positive Finkelstein's test.  Elbows bilaterally and contralateral left wrist normal-appearing nontender normal motion.    Lab and Radiology Results Limited musculoskeletal ultrasound hypoechoic fluid surrounding first dorsal wrist compartment tendon.  Tender to palpation with ultrasound probe.  No cortical defects or abnormal bony structures.  Tendon appears to be intact.  Other dorsal  wrist compartments are normal-appearing Impression: De Quervain's tenosynovitis    Assessment and Plan: 58 y.o. female with right wrist pain.  De Quervain's tenosynovitis.  Plan to treat with thumb spica splint, diclofenac gel, and home exercise program.  If not improving neck step would likely be ultrasound-guided injection.  Recheck in about a month or so.   PDMP not reviewed this encounter. No orders of the defined types were placed in this encounter.  Meds ordered this encounter  Medications  . diclofenac sodium (VOLTAREN) 1 % GEL    Sig: Apply 2 g topically 4 (four) times daily. To affected joint.    Dispense:  100 g    Refill:  11    Historical information moved to improve visibility of documentation.  Past Medical History:  Diagnosis Date  . Anginal pain ([place])    turned out to be muscular  . Carpal tunnel syndrome   . GERD (gastroesophageal reflux disease)   . Hypothyroidism    Past Surgical History:  Procedure Laterality Date  . BREAST SURGERY     breast reduction  . CARPAL TUNNEL RELEASE Right   . CARPAL TUNNEL RELEASE Left 11/27/2015   Procedure: LEFT LIMITED OPEN CARPAL TUNNEL RELEASE;  Surgeon: Roseanne Kaufman, MD;  Location: [place];  Service: Orthopedics;  Laterality: Left;  . TOTAL THYROIDECTOMY  10/13/2010   cone  . TUBAL LIGATION     Social History   Tobacco Use  . Smoking status: Former Research scientist (life sciences)  . Smokeless tobacco: Never Used  Substance Use Topics  . Alcohol use: Yes    Comment:  social   family history includes Diabetes in her sister; Stroke in her brother.  Medications: Current Outpatient Medications  Medication Sig Dispense Refill  . calcium carbonate 200 MG capsule Take 250 mg by mouth 2 (two) times daily with a meal. Reported on 11/24/2015    . cholecalciferol (VITAMIN D) 1000 UNITS tablet Take 1,000 Units by mouth daily. Reported on 11/24/2015    . Levothyroxine Sodium (SYNTHROID PO) Take 1 mg by mouth.    . Multiple  Vitamins-Minerals (MULTIVITAMIN WITH MINERALS) tablet Take 1 tablet by mouth daily. Reported on 11/24/2015    . diclofenac sodium (VOLTAREN) 1 % GEL Apply 2 g topically 4 (four) times daily. To affected joint. 100 g 11   No current facility-administered medications for this visit.    No Known Allergies    Discussed warning signs or symptoms. Please see discharge instructions. Patient expresses understanding.

## 2019-05-06 ENCOUNTER — Emergency Department
Admission: EM | Admit: 2019-05-06 | Discharge: 2019-05-06 | Disposition: A | Discharge status: Home / Self Care | Payer: 59 | Source: Home / Self Care

## 2019-05-06 ENCOUNTER — Other Ambulatory Visit: Payer: Self-pay

## 2019-05-06 DIAGNOSIS — R110 Nausea: Secondary | ICD-10-CM

## 2019-05-06 DIAGNOSIS — Z20828 Contact with and (suspected) exposure to other viral communicable diseases: Secondary | ICD-10-CM | POA: Diagnosis not present

## 2019-05-06 DIAGNOSIS — R519 Headache, unspecified: Secondary | ICD-10-CM

## 2019-05-06 DIAGNOSIS — Z20822 Contact with and (suspected) exposure to covid-19: Secondary | ICD-10-CM

## 2019-05-06 NOTE — ED Provider Notes (Signed)
[facility]    CSN: [id number] Arrival date & time: 05/06/19  1006      History   Chief Complaint Chief Complaint  Patient presents with  . Possible COVID exposure    HPI Colleen Carter is a 58 y.o. female.   HPI Colleen Carter is a 58 y.o. female presenting to UC with c/o intermittent generalized headache and mild nausea that started 1 week ago.  Pt was notified 2 days ago that a coworker she works in close contact with tested positive for Covid-19. They do wear masks at work. Pt does not normally get daily headaches. She denies having headache or nausea at this time but wants to be tested for Covid-19.  She has not been taking any medication for her symptoms. Denies fever, chills, cough, congestion, sore throat, vomiting or diarrhea.    Past Medical History:  Diagnosis Date  . Anginal pain ([place])    turned out to be muscular  . Carpal tunnel syndrome   . GERD (gastroesophageal reflux disease)   . Hypothyroidism     Patient Active Problem List   Diagnosis Date Noted  . Elevated hemoglobin A1c measurement 06/19/2018  . Rotator cuff tendonitis, left 04/25/2017  . Hypothyroidism (acquired) 03/07/2014  . Skin ulcer ([place]) 04/03/2012    Past Surgical History:  Procedure Laterality Date  . BREAST SURGERY     breast reduction  . CARPAL TUNNEL RELEASE Right   . CARPAL TUNNEL RELEASE Left 11/27/2015   Procedure: LEFT LIMITED OPEN CARPAL TUNNEL RELEASE;  Surgeon: Roseanne Kaufman, MD;  Location: [facility];  Service: Orthopedics;  Laterality: Left;  . TOTAL THYROIDECTOMY  10/13/2010   cone  . TUBAL LIGATION      OB History   No obstetric history on file.      Home Medications    Prior to Admission medications   Medication Sig Start Date End Date Taking? Authorizing Provider  calcium carbonate 200 MG capsule Take 250 mg by mouth 2 (two) times daily with a meal. Reported on 11/24/2015    [provider]  cholecalciferol (VITAMIN D)  1000 UNITS tablet Take 1,000 Units by mouth daily. Reported on 11/24/2015    [provider]  diclofenac sodium (VOLTAREN) 1 % GEL Apply 2 g topically 4 (four) times daily. To affected joint. 11/01/18   Gregor Hams, MD  Levothyroxine Sodium (SYNTHROID PO) Take 1 mg by mouth.    [provider]  Multiple Vitamins-Minerals (MULTIVITAMIN WITH MINERALS) tablet Take 1 tablet by mouth daily. Reported on 11/24/2015    [provider]    Family History Family History  Problem Relation Age of Onset  . Diabetes Sister   . Stroke Brother     Social History Social History   Tobacco Use  . Smoking status: Former Research scientist (life sciences)  . Smokeless tobacco: Never Used  Substance Use Topics  . Alcohol use: Yes    Comment: social  . Drug use: No     Allergies   Patient has no known allergies.   Review of Systems Review of Systems  Constitutional: Negative for chills and fever.  HENT: Negative for congestion, ear pain, sore throat, trouble swallowing and voice change.   Respiratory: Negative for cough and shortness of breath.   Cardiovascular: Negative for chest pain and palpitations.  Gastrointestinal: Positive for nausea. Negative for abdominal pain, diarrhea and vomiting.  Musculoskeletal: Negative for arthralgias, back pain and myalgias.  Skin: Negative for rash.  Neurological: Positive  for headaches. Negative for dizziness and light-headedness.     Physical Exam Triage Vital Signs ED Triage Vitals  Enc Vitals Group     BP 05/06/19 1034 129/84     Pulse Rate 05/06/19 1034 65     Resp 05/06/19 1034 18     Temp 05/06/19 1034 (!) 97.5 F (36.4 C)     Temp Source 05/06/19 1034 Oral     SpO2 05/06/19 1034 99 %     Weight --      Height 05/06/19 1039 5' 7.5" (1.715 m)     Head Circumference --      Peak Flow --      Pain Score 05/06/19 1039 0     Pain Loc --      Pain Edu? --      Excl. in [place]? --    No data found.  Updated Vital Signs BP 129/84 (BP Location:  Right Arm)   Pulse 65   Temp (!) 97.5 F (36.4 C) (Oral)   Resp 18   Ht 5' 7.5" (1.715 m)   SpO2 99%   BMI 32.71 kg/m   Visual Acuity Right Eye Distance:   Left Eye Distance:   Bilateral Distance:    Right Eye Near:   Left Eye Near:    Bilateral Near:     Physical Exam Vitals signs and nursing note reviewed.  Constitutional:      Appearance: Normal appearance. She is well-developed.  HENT:     Head: Normocephalic and atraumatic.     Right Ear: Tympanic membrane and ear canal normal.     Left Ear: Tympanic membrane and ear canal normal.     Nose: Nose normal.     Mouth/Throat:     Mouth: Mucous membranes are moist.     Pharynx: Oropharynx is clear.  Eyes:     Extraocular Movements: Extraocular movements intact.     Conjunctiva/sclera: Conjunctivae normal.     Pupils: Pupils are equal, round, and reactive to light.  Neck:     Musculoskeletal: Normal range of motion and neck supple. No neck rigidity.  Cardiovascular:     Rate and Rhythm: Normal rate and regular rhythm.  Pulmonary:     Effort: Pulmonary effort is normal. No respiratory distress.     Breath sounds: Normal breath sounds. No stridor. No wheezing, rhonchi or rales.  Abdominal:     Palpations: Abdomen is soft.     Tenderness: There is no abdominal tenderness.  Musculoskeletal: Normal range of motion.  Skin:    General: Skin is warm and dry.     Capillary Refill: Capillary refill takes less than 2 seconds.  Neurological:     General: No focal deficit present.     Mental Status: She is alert and oriented to person, place, and time.  Psychiatric:        Behavior: Behavior normal.      UC Treatments / Results  Labs (all labs ordered are listed, but only abnormal results are displayed) Labs Reviewed  NOVEL CORONAVIRUS, NAA    EKG   Radiology No results found.  Procedures Procedures (including critical care time)  Medications Ordered in UC Medications - No data to display  Initial  Impression / Assessment and Plan / UC Course  I have reviewed the triage vital signs and the nursing notes.  Pertinent labs & imaging results that were available during my care of the patient were reviewed by me and considered in my medical decision  making (see chart for details).     Normal exam Covid-19 test pending AVS provided.  Final Clinical Impressions(s) / UC Diagnoses   Final diagnoses:  Close Exposure to Covid-19 Virus  Generalized headache  Nausea without vomiting     Discharge Instructions      You may take 500mg  acetaminophen every 4-6 hours or in combination with ibuprofen 400-600mg  every 6-8 hours as needed for pain, inflammation, and fever.  Be sure to well hydrated with clear liquids and get at least 8 hours of sleep at night, preferably more while sick.   Please follow up with family medicine in 1 week if needed.  Due to concern for possibly having Covid-19, it is advised that you self-isolate at home until test results come back.  If positive, it is recommended you stay isolated for at least 10 days after symptom onset and 24 hours after last fever without taking medication (whichever is longer).  If you MUST go out, please wear a mask at all times, limit contact with others.   Most results have been coming back within about 2 days.   If your results are negative, you will NOT be receiving a phone call. You may check your MyChart account, please see in this packet how to set on up if you do not already have one. There is also an app for phones you can download.   You WILL be notified for POSITIVE results.      ED Prescriptions    None     Controlled Substance Prescriptions [place] Controlled Substance Registry consulted? Not Applicable   Tyrell Antonio 05/06/19 1106

## 2019-05-06 NOTE — ED Triage Notes (Signed)
Pt here today for COVID test due to coworker testing positive. No sxs other than HA and some nausea last week.

## 2019-05-06 NOTE — Discharge Instructions (Signed)
°  You may take 500mg  acetaminophen every 4-6 hours or in combination with ibuprofen 400-600mg  every 6-8 hours as needed for pain, inflammation, and fever.  Be sure to well hydrated with clear liquids and get at least 8 hours of sleep at night, preferably more while sick.   Please follow up with family medicine in 1 week if needed.  Due to concern for possibly having Covid-19, it is advised that you self-isolate at home until test results come back.  If positive, it is recommended you stay isolated for at least 10 days after symptom onset and 24 hours after last fever without taking medication (whichever is longer).  If you MUST go out, please wear a mask at all times, limit contact with others.   Most results have been coming back within about 2 days.   If your results are negative, you will NOT be receiving a phone call. You may check your MyChart account, please see in this packet how to set on up if you do not already have one. There is also an app for phones you can download.   You WILL be notified for POSITIVE results.

## 2019-05-07 LAB — NOVEL CORONAVIRUS, NAA: SARS-CoV-2, NAA: NOT DETECTED

## 2019-06-21 LAB — HM MAMMOGRAPHY

## 2019-06-25 ENCOUNTER — Encounter: Payer: Self-pay | Admitting: Nurse Practitioner

## 2019-10-17 DIAGNOSIS — M25531 Pain in right wrist: Secondary | ICD-10-CM | POA: Insufficient documentation

## 2019-12-19 ENCOUNTER — Ambulatory Visit: Payer: 59 | Admitting: Podiatry

## 2019-12-30 ENCOUNTER — Other Ambulatory Visit: Payer: Self-pay

## 2019-12-30 ENCOUNTER — Ambulatory Visit: Payer: 59 | Admitting: Podiatry

## 2019-12-30 ENCOUNTER — Encounter: Payer: Self-pay | Admitting: Podiatry

## 2019-12-30 VITALS — Temp 97.8°F

## 2019-12-30 DIAGNOSIS — M7672 Peroneal tendinitis, left leg: Secondary | ICD-10-CM | POA: Diagnosis not present

## 2019-12-30 DIAGNOSIS — L282 Other prurigo: Secondary | ICD-10-CM | POA: Insufficient documentation

## 2019-12-30 DIAGNOSIS — R102 Pelvic and perineal pain: Secondary | ICD-10-CM | POA: Insufficient documentation

## 2019-12-30 DIAGNOSIS — M722 Plantar fascial fibromatosis: Secondary | ICD-10-CM | POA: Diagnosis not present

## 2019-12-30 DIAGNOSIS — N83201 Unspecified ovarian cyst, right side: Secondary | ICD-10-CM | POA: Insufficient documentation

## 2019-12-30 DIAGNOSIS — D259 Leiomyoma of uterus, unspecified: Secondary | ICD-10-CM | POA: Insufficient documentation

## 2019-12-30 DIAGNOSIS — A6000 Herpesviral infection of urogenital system, unspecified: Secondary | ICD-10-CM | POA: Insufficient documentation

## 2019-12-30 NOTE — Patient Instructions (Signed)
Thank you!  Colleen Carter

## 2019-12-30 NOTE — Progress Notes (Signed)
Subjective:   Patient ID: Colleen Carter, female   DOB: 59 y.o.   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 and it is helped keep her walking   ROS      Objective:  Physical Exam  Neurovascular status intact negative Bevelyn Buckles' sign noted with patient's feet showing chronic inflammation with quite a bit of pain in the lateral side of the left fifth metatarsal with fluid buildup and mild this pain in the plantar aspect of the heel region bilateral     Assessment:  Acute peroneal tendinitis left with moderate plantar fasciitis bilateral     Plan:  H&P conditions reviewed and I went ahead today and for the left lateral I did sterile prep and injected the peroneal insertion 3 mg Kenalog 5 mg Xylocaine and I advised on anti-inflammatories and I went ahead and casted for [facility] type orthotics for the long-term

## 2020-08-13 ENCOUNTER — Telehealth: Payer: Self-pay | Admitting: Osteopathic Medicine

## 2020-08-13 NOTE — Telephone Encounter (Signed)
Rubin Payor from [employer] called to verify Dr. Ila Mcgill (podiatrist) received their  request for patient's medical records for auditing purposes for DOS 12/30/2019. Request was sent via carrier mail on 07/23/2020. Informed Rubin Payor she called the wrong office. Please advise at [phone], reference # [id number].

## 2021-05-23 ENCOUNTER — Emergency Department
Admission: EM | Admit: 2021-05-23 | Discharge: 2021-05-23 | Disposition: A | Discharge status: Home / Self Care | Payer: 59 | Source: Home / Self Care | Attending: Family Medicine | Admitting: Family Medicine

## 2021-05-23 ENCOUNTER — Encounter: Payer: Self-pay | Admitting: Emergency Medicine

## 2021-05-23 DIAGNOSIS — M7918 Myalgia, other site: Secondary | ICD-10-CM | POA: Diagnosis not present

## 2021-05-23 MED ORDER — NAPROXEN SODIUM 550 MG PO TABS: 550.0000 mg | ORAL_TABLET | Freq: Two times a day (BID) | ORAL | 0 refills | Status: AC

## 2021-05-23 MED ORDER — BACLOFEN 10 MG PO TABS: 10.0000 mg | ORAL_TABLET | Freq: Three times a day (TID) | ORAL | 0 refills | Status: AC

## 2021-05-23 NOTE — ED Triage Notes (Signed)
Patient c/o right sided neck pain x 4 days causing right ear pain.  No apparent injury.  Patient has tried Tylenol.

## 2021-05-23 NOTE — Discharge Instructions (Signed)
You may use ice or heat to the painful neck muscles Take the Anaprox 2 times a day with food.  Take 2 doses today Lioresal as a muscle relaxer.  You may take 3 times a day.  If needed you may take 2 at night See your doctor if not improving by next week

## 2021-05-23 NOTE — ED Provider Notes (Signed)
[facility]    CSN: [id number] Arrival date & time: 05/23/21  0920      History   Chief Complaint Chief Complaint  Patient presents with   Neck Pain    HPI Colleen Carter is a 60 y.o. female.   HPI  Patient is here for neck pain.  She woke up with it 3 days ago.  She states is progressively getting worse.  Last night she could hardly sleep.  Its on the right side of her neck.  No numbness or weakness into the arms.  No fall or injury.  No prior neck arthritis or pain.  Past Medical History:  Diagnosis Date   Anginal pain ([place])    turned out to be muscular   Carpal tunnel syndrome    GERD (gastroesophageal reflux disease)    Hypothyroidism     Patient Active Problem List   Diagnosis Date Noted   Cyst of right ovary 12/30/2019   Genital herpes simplex 12/30/2019   Pain in pelvis 12/30/2019   Pruritic rash 12/30/2019   Uterine leiomyoma 12/30/2019   Acute pain of right wrist 10/17/2019   Elevated hemoglobin A1c measurement 06/19/2018   Low back pain 09/19/2017   Anemia 05/24/2017   Rotator cuff tendonitis, left 04/25/2017   Hypothyroidism (acquired) 03/07/2014   Skin ulcer ([place]) 04/03/2012   Carcinoma in situ 07/20/2004   Endometriosis 09/05/2002    Past Surgical History:  Procedure Laterality Date   BREAST SURGERY     breast reduction   CARPAL TUNNEL RELEASE Right    CARPAL TUNNEL RELEASE Left 11/27/2015   Procedure: LEFT LIMITED OPEN CARPAL TUNNEL RELEASE;  Surgeon: Roseanne Kaufman, MD;  Location: [facility];  Service: Orthopedics;  Laterality: Left;   TOTAL THYROIDECTOMY  10/13/2010   cone   TUBAL LIGATION      OB History   No obstetric history on file.      Home Medications    Prior to Admission medications   Medication Sig Start Date End Date Taking? Authorizing Provider  baclofen (LIORESAL) 10 MG tablet Take 1 tablet (10 mg total) by mouth 3 (three) times daily. 05/23/21  Yes Raylene Everts, MD   Calcium-Magnesium-Zinc (CALCIUM-MAGNESUIUM-ZINC PO) Take 1 tablet by mouth daily.   Yes [provider]  Cholecalciferol (VITAMIN D3 PO) Vitamin D3   Yes [provider]  CRANBERRY-VITAMIN C PO Take 4,000 mg by mouth every other day.   Yes [provider]  levothyroxine (SYNTHROID) 100 MCG tablet Take 100 mcg by mouth every morning. 12/23/19  Yes [provider]  Multiple Vitamins-Minerals (MULTIVITAMIN WITH MINERALS) tablet Take 1 tablet by mouth daily. Reported on 11/24/2015   Yes [provider]  naproxen sodium (ANAPROX DS) 550 MG tablet Take 1 tablet (550 mg total) by mouth 2 (two) times daily with a meal. 05/23/21  Yes Raylene Everts, MD    Family History Family History  Problem Relation Age of Onset   Diabetes Sister    Stroke Brother     Social History Social History   Tobacco Use   Smoking status: Former   Smokeless tobacco: Never  Scientific laboratory technician Use: Never used  Substance Use Topics   Alcohol use: Yes    Comment: social   Drug use: No     Allergies   Patient has no known allergies.   Review of Systems Review of Systems See HPI  Physical Exam Triage Vital Signs ED Triage Vitals [05/23/21  0946]  Enc Vitals Group     BP 137/75     Pulse Rate 69     Resp 18     Temp 98.3 F (36.8 C)     Temp Source Oral     SpO2 100 %     Weight 185 lb (83.9 kg)     Height 5' 7.5" (1.715 m)     Head Circumference      Peak Flow      Pain Score 8     Pain Loc      Pain Edu?      Excl. in [place]?    No data found.  Updated Vital Signs BP 137/75 (BP Location: Right Arm)   Pulse 69   Temp 98.3 F (36.8 C) (Oral)   Resp 18   Ht 5' 7.5" (1.715 m)   Wt 83.9 kg   SpO2 100%   BMI 28.55 kg/m       Physical Exam Constitutional:      General: She is not in acute distress.    Appearance: She is well-developed and normal weight.     Comments: Guarded Depo movements  HENT:     Head: Normocephalic and atraumatic.      Right Ear: Tympanic membrane, ear canal and external ear normal.     Left Ear: Tympanic membrane, ear canal and external ear normal.     Nose: No congestion or rhinorrhea.     Mouth/Throat:     Mouth: Mucous membranes are moist.     Pharynx: No posterior oropharyngeal erythema.  Eyes:     Conjunctiva/sclera: Conjunctivae normal.     Pupils: Pupils are equal, round, and reactive to light.  Neck:     Comments: Slow but full range of motion in the neck.  Tenderness in the right upper body of the trapezius to the occiput. Cardiovascular:     Rate and Rhythm: Normal rate.  Pulmonary:     Effort: Pulmonary effort is normal. No respiratory distress.  Abdominal:     General: There is no distension.     Palpations: Abdomen is soft.  Musculoskeletal:        General: Normal range of motion.     Cervical back: Normal range of motion.  Skin:    General: Skin is warm and dry.  Neurological:     General: No focal deficit present.     Mental Status: She is alert.     Sensory: No sensory deficit.     Motor: No weakness.     Gait: Gait normal.     Deep Tendon Reflexes: Reflexes normal.  Psychiatric:        Mood and Affect: Mood normal.        Behavior: Behavior normal.     UC Treatments / Results  Labs (all labs ordered are listed, but only abnormal results are displayed) Labs Reviewed - No data to display  EKG   Radiology No results found.  Procedures Procedures (including critical care time)  Medications Ordered in UC Medications - No data to display  Initial Impression / Assessment and Plan / UC Course  I have reviewed the triage vital signs and the nursing notes.  Pertinent labs & imaging results that were available during my care of the patient were reviewed by me and considered in my medical decision making (see chart for details).     Muscular neck pain.  We will treat with naproxen for pain and inflammation, baclofen as muscle  relaxer.  Ice or heat.  Gentle  stretching.  Return as needed Final Clinical Impressions(s) / UC Diagnoses   Final diagnoses:  Musculoskeletal pain     Discharge Instructions      You may use ice or heat to the painful neck muscles Take the Anaprox 2 times a day with food.  Take 2 doses today Lioresal as a muscle relaxer.  You may take 3 times a day.  If needed you may take 2 at night See your doctor if not improving by next week   ED Prescriptions     Medication Sig Dispense Auth. Provider   baclofen (LIORESAL) 10 MG tablet Take 1 tablet (10 mg total) by mouth 3 (three) times daily. 69 each Raylene Everts, MD   naproxen sodium (ANAPROX DS) 550 MG tablet Take 1 tablet (550 mg total) by mouth 2 (two) times daily with a meal. 30 tablet Raylene Everts, MD      PDMP not reviewed this encounter.   Raylene Everts, MD 05/23/21 1013

## 2021-07-28 ENCOUNTER — Encounter: Payer: Self-pay | Admitting: Osteopathic Medicine

## 2022-04-14 ENCOUNTER — Emergency Department (HOSPITAL_BASED_OUTPATIENT_CLINIC_OR_DEPARTMENT_OTHER): Payer: 59

## 2022-04-14 ENCOUNTER — Encounter (HOSPITAL_BASED_OUTPATIENT_CLINIC_OR_DEPARTMENT_OTHER): Payer: Self-pay

## 2022-04-14 ENCOUNTER — Other Ambulatory Visit: Payer: Self-pay

## 2022-04-14 ENCOUNTER — Emergency Department (HOSPITAL_BASED_OUTPATIENT_CLINIC_OR_DEPARTMENT_OTHER)
Admission: EM | Admit: 2022-04-14 | Discharge: 2022-04-14 | Disposition: A | Discharge status: Home / Self Care | Payer: 59 | Attending: Emergency Medicine | Admitting: Emergency Medicine

## 2022-04-14 DIAGNOSIS — R0789 Other chest pain: Secondary | ICD-10-CM | POA: Diagnosis present

## 2022-04-14 DIAGNOSIS — K219 Gastro-esophageal reflux disease without esophagitis: Secondary | ICD-10-CM | POA: Insufficient documentation

## 2022-04-14 LAB — CBC
HCT: 38 % (ref 36.0–46.0)
Hemoglobin: 12.5 g/dL (ref 12.0–15.0)
MCH: 27.5 pg (ref 26.0–34.0)
MCHC: 32.9 g/dL (ref 30.0–36.0)
MCV: 83.7 fL (ref 80.0–100.0)
Platelets: 246 10*3/uL (ref 150–400)
RBC: 4.54 MIL/uL (ref 3.87–5.11)
RDW: 13.4 % (ref 11.5–15.5)
WBC: 4.6 10*3/uL (ref 4.0–10.5)
nRBC: 0 % (ref 0.0–0.2)

## 2022-04-14 LAB — HEPATIC FUNCTION PANEL
ALT: 21 U/L (ref 0–44)
AST: 23 U/L (ref 15–41)
Albumin: 4 g/dL (ref 3.5–5.0)
Alkaline Phosphatase: 90 U/L (ref 38–126)
Bilirubin, Direct: <0.1 mg/dL (ref 0.0–0.2)
Total Bilirubin: 0.7 mg/dL (ref 0.3–1.2)
Total Protein: 8 g/dL (ref 6.5–8.1)

## 2022-04-14 LAB — BASIC METABOLIC PANEL
Anion gap: 8 (ref 5–15)
BUN: 17 mg/dL (ref 6–20)
CO2: 24 mmol/L (ref 22–32)
Calcium: 9.4 mg/dL (ref 8.9–10.3)
Chloride: 104 mmol/L (ref 98–111)
Creatinine, Ser: 0.52 mg/dL (ref 0.44–1.00)
GFR, Estimated: >60 mL/min (ref >60)
Glucose, Bld: 106 mg/dL — ABNORMAL HIGH (ref 70–99)
Potassium: 4 mmol/L (ref 3.5–5.1)
Sodium: 136 mmol/L (ref 135–145)

## 2022-04-14 LAB — LIPASE, BLOOD: Lipase: 31 U/L (ref 11–51)

## 2022-04-14 LAB — TROPONIN I (HIGH SENSITIVITY): Troponin I (High Sensitivity): 3 ng/L (ref <18)

## 2022-04-14 MED ORDER — PANTOPRAZOLE SODIUM 20 MG PO TBEC: 20.0000 mg | DELAYED_RELEASE_TABLET | Freq: Every day | ORAL | 0 refills | Status: AC

## 2022-04-14 MED ORDER — PANTOPRAZOLE SODIUM 40 MG PO TBEC
40.0000 mg | DELAYED_RELEASE_TABLET | Freq: Once | ORAL | Status: AC
  Administered 2022-04-14: 40 mg via ORAL
  Filled 2022-04-14: qty 1

## 2022-04-14 NOTE — Discharge Instructions (Signed)
1.  Start Protonix daily as prescribed 2.  Follow dietary recommendations for gastroesophageal reflux disease. 3.  Follow-up with your doctor for recheck next week. 4.  Return to the emergency department if you develop shortness of breath, lightheadedness, nausea, sweating or other concerning symptoms.

## 2022-04-14 NOTE — ED Notes (Signed)
Patient transported to X-ray 

## 2022-04-14 NOTE — ED Triage Notes (Signed)
For 3 weeks having epigastric pain radiating to right side of back, vomiting at night. Started taking pepcid Monday with some improvement. Pain worsened again this morning.

## 2022-04-14 NOTE — ED Provider Notes (Signed)
[place] EMERGENCY DEPARTMENT Provider Note   CSN: [id number] Arrival date & time: 04/14/22  1138     History  Chief Complaint  Patient presents with   Chest Pain    Colleen Carter is a 61 y.o. female.  HPI Patient reports for the past 3 weeks she has been getting symptoms of discomfort in her epigastrium and central chest.  She reports also sometimes she gets pains that are fairly sharp over on the right side of the lower chest.  Symptoms typically happen at nighttime.  She reports there have been 2 nights over the past week or so when she is awakened at night vomiting.  She reports last time she vomited was several days ago.  She does note that there are certain foods that seem to make her symptoms worse.  She does note she is eating a lot of tomato sandwiches for the past week or so and that might be making her symptoms worse.  Typically during the daytime she feels all right.  Her doctor has suggested she start Pepcid.  She has been taking it but not consistently.  When she takes that she thinks there is some improvement.  She has never been on a continuous treatment for reflux.  She does report however she will get symptoms of heartburn particular with certain foods.  No shortness of breath, no fever, no productive cough.  No swelling of the legs.  No calf pain    Home Medications Prior to Admission medications   Medication Sig Start Date End Date Taking? Authorizing Provider  pantoprazole (PROTONIX) 20 MG tablet Take 1 tablet (20 mg total) by mouth daily. 04/14/22  Yes Charlesetta Shanks, MD  baclofen (LIORESAL) 10 MG tablet Take 1 tablet (10 mg total) by mouth 3 (three) times daily. 05/23/21   Raylene Everts, MD  Calcium-Magnesium-Zinc (CALCIUM-MAGNESUIUM-ZINC PO) Take 1 tablet by mouth daily.    [provider]  Cholecalciferol (VITAMIN D3 PO) Vitamin D3    [provider]  CRANBERRY-VITAMIN C PO Take 4,000 mg by mouth every other day.    [provider]  levothyroxine (SYNTHROID) 100 MCG tablet Take 100 mcg by mouth every morning. 12/23/19   [provider]  Multiple Vitamins-Minerals (MULTIVITAMIN WITH MINERALS) tablet Take 1 tablet by mouth daily. Reported on 11/24/2015    [provider]  naproxen sodium (ANAPROX DS) 550 MG tablet Take 1 tablet (550 mg total) by mouth 2 (two) times daily with a meal. 05/23/21   Raylene Everts, MD      Allergies    Patient has no known allergies.    Review of Systems   Review of Systems 10 Systems reviewed negative except as per HPI Physical Exam Updated Vital Signs BP 107/65   Pulse (!) 53   Temp 98.3 F (36.8 C)   Resp 18   Ht 5' 7.5" (1.715 m)   Wt 90.7 kg   SpO2 100%   BMI 30.86 kg/m  Physical Exam Constitutional:      Comments: Alert nontoxic clinically well in appearance.  HENT:     Head: Normocephalic and atraumatic.     Mouth/Throat:     Pharynx: Oropharynx is clear.  Eyes:     Extraocular Movements: Extraocular movements intact.  Cardiovascular:     Rate and Rhythm: Normal rate and regular rhythm.  Pulmonary:     Effort: Pulmonary effort is normal.     Breath sounds: Normal breath sounds.  Abdominal:  Comments: Mild epigastric discomfort to palpation.  Mild right upper quadrant tenderness to palpation.  No guarding.  Lower abdomen nontender.  Musculoskeletal:        General: No swelling or tenderness. Normal range of motion.     Cervical back: Neck supple.     Right lower leg: No edema.     Left lower leg: No edema.  Skin:    General: Skin is dry.  Neurological:     General: No focal deficit present.     Mental Status: She is oriented to person, place, and time.     Motor: No weakness.     Coordination: Coordination normal.  Psychiatric:        Mood and Affect: Mood normal.     ED Results / Procedures / Treatments   Labs (all labs ordered are listed, but only abnormal results are displayed) Labs Reviewed  BASIC METABOLIC  PANEL - Abnormal; Notable for the following components:      Result Value   Glucose, Bld 106 (*)    All other components within normal limits  CBC  LIPASE, BLOOD  HEPATIC FUNCTION PANEL  TROPONIN I (HIGH SENSITIVITY)    EKG EKG Interpretation  Date/Time:  Thursday April 14 2022 11:52:19 EDT Ventricular Rate:  72 PR Interval:  164 QRS Duration: 70 QT Interval:  380 QTC Calculation: 416 R Axis:   51 Text Interpretation: Normal sinus rhythm Normal ECG When compared with ECG of 04-Oct-2010 11:16, PREVIOUS ECG IS PRESENT no change from previous Confirmed by Charlesetta Shanks [phone]) on 04/14/2022 1:16:55 PM  Radiology US Abdomen Limited RUQ (LIVER/GB)  Result Date: 04/14/2022 CLINICAL DATA:  Epigastric pain EXAM: ULTRASOUND ABDOMEN LIMITED RIGHT UPPER QUADRANT COMPARISON:  CT abdomen pelvis dated 07/20/2017. FINDINGS: Gallbladder: No gallstones or wall thickening visualized. No sonographic Murphy sign noted by sonographer. Common bile duct: Diameter: 1 mm Liver: No focal lesion identified. Within normal limits in parenchymal echogenicity. Portal vein is patent on color Doppler imaging with normal direction of blood flow towards the liver. Other: None. IMPRESSION: No findings to explain the patient's symptoms. Electronically Signed   By: Zerita Boers M.D.   On: 04/14/2022 14:51   DG Chest 2 View  Result Date: 04/14/2022 CLINICAL DATA:  Chest pain EXAM: CHEST - 2 VIEW COMPARISON:  10/04/2010 FINDINGS: The heart size and mediastinal contours are within normal limits. Both lungs are clear. There is 9 mm sclerotic density in the head of the right humerus. This finding was not seen on the previous study. There are surgical clips in the thyroid bed. Questionable small hiatal hernia is seen. IMPRESSION: No active cardiopulmonary disease. There is a sclerotic density in the head of the right humerus which is not fully evaluated. This may suggest a benign bone island or adjacent soft tissue  calcification or neoplastic process. Follow-up routine radiographs of the right shoulder may be considered. Electronically Signed   By: Elmer Picker M.D.   On: 04/14/2022 12:07    Procedures Procedures    Medications Ordered in ED Medications  pantoprazole (PROTONIX) EC tablet 40 mg (40 mg Oral Given 04/14/22 1314)    ED Course/ Medical Decision Making/ A&P                           Medical Decision Making Amount and/or Complexity of Data Reviewed Labs: ordered. Radiology: ordered.  Risk Prescription drug management.   Patient presents with 2 to 3 weeks of symptoms.  She is experiencing central chest discomfort which is usually burning in quality and right sided lower thoracic pain.  Patient does not have associated symptoms suggestive of ischemia.  However with patient's age we will proceed with EKG and troponins.  Will also proceed with hepatic function lipase and ultrasound of the gallbladder.  [place] diagnosis includes ACS\aortic disease\pancreatitis\biliary colic\peptic ulcer disease\GERD.  Ultrasound interpreted by radiology negative for any acute findings.  At this time patient does not have gallstones or gallbladder wall thickening.  Troponin is normal.  EKG is normal and unchanged from previous.  Patient symptoms are most suggestive of GERD.  With labs and ultrasound ruling out other diagnoses.  Plan will be to initiate Protonix and provide instructions for management of gastroesophageal reflux disease.  I have reviewed with the patient careful return precautions including changing or different chest pain, shortness of breath, nausea, lightheadedness, sweating or other concerning changes.  Patient voices understanding.  Her plan will be to follow-up with PCP and review response to treatment and further diagnostic evaluation if needed.          Final Clinical Impression(s) / ED Diagnoses Final diagnoses:  Atypical chest pain  Gastroesophageal reflux disease,  unspecified whether esophagitis present    Rx / DC Orders ED Discharge Orders          Ordered    pantoprazole (PROTONIX) 20 MG tablet  Daily        04/14/22 1527              Charlesetta Shanks, MD 04/14/22 1530

## 2024-04-01 ENCOUNTER — Other Ambulatory Visit: Payer: Self-pay

## 2024-04-01 ENCOUNTER — Encounter (HOSPITAL_BASED_OUTPATIENT_CLINIC_OR_DEPARTMENT_OTHER): Payer: Self-pay

## 2024-04-01 ENCOUNTER — Emergency Department (HOSPITAL_BASED_OUTPATIENT_CLINIC_OR_DEPARTMENT_OTHER)

## 2024-04-01 ENCOUNTER — Emergency Department (HOSPITAL_BASED_OUTPATIENT_CLINIC_OR_DEPARTMENT_OTHER)
Admission: EM | Admit: 2024-04-01 | Discharge: 2024-04-01 | Disposition: A | Discharge status: Home / Self Care | Attending: Emergency Medicine | Admitting: Emergency Medicine

## 2024-04-01 DIAGNOSIS — R6884 Jaw pain: Secondary | ICD-10-CM | POA: Diagnosis present

## 2024-04-01 LAB — CBC WITH DIFFERENTIAL/PLATELET
Abs Immature Granulocytes: 0.03 K/uL (ref 0.00–0.07)
Basophils Absolute: 0 K/uL (ref 0.0–0.1)
Basophils Relative: 1 %
Eosinophils Absolute: 0.1 K/uL (ref 0.0–0.5)
Eosinophils Relative: 1 %
HCT: 37.8 % (ref 36.0–46.0)
Hemoglobin: 12 g/dL (ref 12.0–15.0)
Immature Granulocytes: 1 %
Lymphocytes Relative: 44 %
Lymphs Abs: 1.6 K/uL (ref 0.7–4.0)
MCH: 26.5 pg (ref 26.0–34.0)
MCHC: 31.7 g/dL (ref 30.0–36.0)
MCV: 83.4 fL (ref 80.0–100.0)
Monocytes Absolute: 0.4 K/uL (ref 0.1–1.0)
Monocytes Relative: 10 %
Neutro Abs: 1.6 K/uL — ABNORMAL LOW (ref 1.7–7.7)
Neutrophils Relative %: 43 %
Platelets: 202 K/uL (ref 150–400)
RBC: 4.53 MIL/uL (ref 3.87–5.11)
RDW: 13.1 % (ref 11.5–15.5)
Smear Review: NORMAL
WBC: 3.7 K/uL — ABNORMAL LOW (ref 4.0–10.5)
nRBC: 0 % (ref 0.0–0.2)

## 2024-04-01 LAB — BASIC METABOLIC PANEL WITH GFR
Anion gap: 9 (ref 5–15)
BUN: 15 mg/dL (ref 8–23)
CO2: 26 mmol/L (ref 22–32)
Calcium: 9.7 mg/dL (ref 8.9–10.3)
Chloride: 103 mmol/L (ref 98–111)
Creatinine, Ser: 0.56 mg/dL (ref 0.44–1.00)
GFR, Estimated: >60 mL/min (ref >60)
Glucose, Bld: 95 mg/dL (ref 70–99)
Potassium: 4.5 mmol/L (ref 3.5–5.1)
Sodium: 138 mmol/L (ref 135–145)

## 2024-04-01 LAB — TROPONIN T, HIGH SENSITIVITY
Troponin T High Sensitivity: <15 ng/L (ref <19)
Troponin T High Sensitivity: <15 ng/L (ref <19)

## 2024-04-01 NOTE — Discharge Instructions (Addendum)
 You were seen for left jaw pain.  Your workup was reassuring.  You may take Tylenol /Motrin  as needed for pain.  Please follow-up with your dentist or PCP for further evaluation and workup if your symptoms persist.  Thank you for letting us  treat you today. After reviewing your labs and imaging, I feel you are safe to go home. Please follow up with your PCP in the next several days and provide them with your records from this visit. Return to the Emergency Room if pain becomes severe or symptoms worsen.

## 2024-04-01 NOTE — ED Provider Notes (Cosign Needed Addendum)
 [place] [facility] DEPARTMENT AT [facility] Provider Note   CSN: [id number] Arrival date & time: 04/01/24  1238     Patient presents with: Jaw Pain   Colleen Carter is a 63 y.o. female.  Presents today with intermittent left-sided jaw pain x 3 months.  Patient was seen at her dentist today and voiced concern, they felt it was likely from grinding however suggested coming to the emergency department for a cardiac workup.  Patient denies chest pain, shortness of breath, sore throat, mouth swelling, difficulty breathing, fever, chills, swelling, vision changes, headache, any other complaints at this time.   HPI     Prior to Admission medications   Medication Sig Start Date End Date Taking? Authorizing Provider  baclofen  (LIORESAL ) 10 MG tablet Take 1 tablet (10 mg total) by mouth 3 (three) times daily. 05/23/21   Maranda Jamee Jacob, MD  Calcium-Magnesium-Zinc (CALCIUM-MAGNESUIUM-ZINC PO) Take 1 tablet by mouth daily.    [provider]  Cholecalciferol (VITAMIN D3 PO) Vitamin D3    [provider]  CRANBERRY-VITAMIN C PO Take 4,000 mg by mouth every other day.    [provider]  levothyroxine (SYNTHROID) 100 MCG tablet Take 100 mcg by mouth every morning. 12/23/19   [provider]  Multiple Vitamins-Minerals (MULTIVITAMIN WITH MINERALS) tablet Take 1 tablet by mouth daily. Reported on 11/24/2015    [provider]  naproxen  sodium (ANAPROX  DS) 550 MG tablet Take 1 tablet (550 mg total) by mouth 2 (two) times daily with a meal. 05/23/21   Maranda Jamee Jacob, MD  pantoprazole  (PROTONIX ) 20 MG tablet Take 1 tablet (20 mg total) by mouth daily. 04/14/22   Armenta Canning, MD    Allergies: Patient has no known allergies.    Review of Systems  Musculoskeletal:  Positive for arthralgias.    Updated Vital Signs BP (!) 154/94 (BP Location: Right Arm)   Pulse (!) 56   Temp 98 F (36.7 C) (Oral)   Resp 17   SpO2 100%   Physical  Exam Vitals and nursing note reviewed.  Constitutional:      General: She is not in acute distress.    Appearance: She is well-developed.  HENT:     Head: Normocephalic and atraumatic.     Jaw: There is normal jaw occlusion. No trismus, tenderness, pain on movement or malocclusion.     Right Ear: External ear normal.     Left Ear: External ear normal.     Mouth/Throat:     Mouth: Mucous membranes are moist.     Dentition: Normal dentition. No dental tenderness, gingival swelling, dental caries, dental abscesses or gum lesions.     Tongue: Tongue does not deviate from midline.     Pharynx: Oropharynx is clear. Uvula midline. No posterior oropharyngeal erythema or uvula swelling.     Tonsils: No tonsillar exudate or tonsillar abscesses.     Comments: No floor mouth swelling Eyes:     Conjunctiva/sclera: Conjunctivae normal.  Cardiovascular:     Rate and Rhythm: Normal rate and regular rhythm.     Pulses: Normal pulses.     Heart sounds: Normal heart sounds. No murmur heard. Pulmonary:     Effort: Pulmonary effort is normal. No respiratory distress.     Breath sounds: Normal breath sounds.  Abdominal:     Palpations: Abdomen is soft.     Tenderness: There is no abdominal tenderness.  Musculoskeletal:        General: No swelling.  Cervical back: Neck supple.  Skin:    General: Skin is warm and dry.     Capillary Refill: Capillary refill takes less than 2 seconds.  Neurological:     General: No focal deficit present.     Mental Status: She is alert and oriented to person, place, and time.     Sensory: No sensory deficit.     Motor: No weakness.  Psychiatric:        Mood and Affect: Mood normal.     (all labs ordered are listed, but only abnormal results are displayed) Labs Reviewed  CBC WITH DIFFERENTIAL/PLATELET - Abnormal; Notable for the following components:      Result Value   WBC 3.7 (*)    Neutro Abs 1.6 (*)    All other components within normal limits  BASIC  METABOLIC PANEL WITH GFR  TROPONIN T, HIGH SENSITIVITY  TROPONIN T, HIGH SENSITIVITY    EKG: EKG Interpretation Date/Time:  Monday April 01 2024 13:06:58 EDT Ventricular Rate:  51 PR Interval:  168 QRS Duration:  80 QT Interval:  423 QTC Calculation: 390 R Axis:   47  Text Interpretation: Sinus rhythm Low voltage, precordial leads Abnormal R-wave progression, early transition no significant change since 2023 Confirmed by Freddi Hamilton [phone]) on 04/01/2024 1:18:06 PM  Radiology: [facility] Chest 2 View Result Date: 04/01/2024 CLINICAL DATA:  jaw pain. Intermittent left-sided jaw pain for 3 months. EXAM: CHEST - 2 VIEW COMPARISON:  04/14/2022. FINDINGS: Bilateral lung fields are clear. Bilateral costophrenic angles are clear. Normal cardio-mediastinal silhouette. No acute osseous abnormalities. The soft tissues are within normal limits. IMPRESSION: No active cardiopulmonary disease. Electronically Signed   By: Ree Molt M.D.   On: 04/01/2024 15:10     Procedures   Medications Ordered in the ED - No data to display                                  Medical Decision Making Amount and/or Complexity of Data Reviewed Labs: ordered. Radiology: ordered.   This patient presents to the ED for concern of left-sided jaw pain, this involves an extensive number of treatment options, and is a complaint that carries with it a high risk of complications and morbidity.  The differential diagnosis includes STEMI, NSTEMI, arrhythmia, electrolyte abnormality, anemia, musculoskeletal pain   Co morbidities / Chronic conditions that complicate the patient evaluation  Anemia, GERD, anginal pain   Additional history obtained:  Additional history obtained from EMR External records from outside source obtained and reviewed including Care Everywhere   Lab Tests:  I Ordered, and personally interpreted labs.  The pertinent results include: Leukopenia at 3.7, BMP WNL, troponin less than  15   Imaging Studies ordered:  I ordered imaging studies including chest x-ray I independently visualized and interpreted imaging which showed no active cardiopulmonary disease I agree with the radiologist interpretation   Cardiac Monitoring: / EKG:  The patient was maintained on a cardiac monitor.  I personally viewed and interpreted the cardiac monitored which showed an underlying rhythm of: Sinus rhythm, abnormal R wave progression, no significant change since last tracing   Test / Admission - Considered:  Considered for admission or further workup however patient's vital signs, physical exam, labs, and imaging are reassuring.  Patient advised to follow-up with her dentist for further evaluation and workup.  Patient given return precautions.  I feel patient safe for discharge at this time.  Final diagnoses:  Jaw pain    ED Discharge Orders     None          Francis Ileana SAILOR, PA-C 04/01/24 1529    Francis Ileana SAILOR, PA-C 04/01/24 1618    Freddi Hamilton, MD 04/02/24 [phone]

## 2024-04-01 NOTE — ED Triage Notes (Signed)
 Pt presents with reports of intermittent left-sided jaw pain X 3 mths.  Denies chest pain, SOB or pain radiating.
